# Patient Record
Sex: Female | Born: 1935 | Race: Black or African American | Hispanic: No | Smoking: Never smoker
Health system: Southern US, Community
[De-identification: ages and names within clinical notes are randomized; demographics above are authoritative.]

## PROBLEM LIST (undated history)

## (undated) DIAGNOSIS — S72002K Fracture of unspecified part of neck of left femur, subsequent encounter for closed fracture with nonunion: Secondary | ICD-10-CM

## (undated) DIAGNOSIS — I82409 Acute embolism and thrombosis of unspecified deep veins of unspecified lower extremity: Secondary | ICD-10-CM

## (undated) DIAGNOSIS — R011 Cardiac murmur, unspecified: Secondary | ICD-10-CM

## (undated) DIAGNOSIS — D62 Acute posthemorrhagic anemia: Secondary | ICD-10-CM

## (undated) DIAGNOSIS — R05 Cough: Secondary | ICD-10-CM

## (undated) DIAGNOSIS — I10 Essential (primary) hypertension: Secondary | ICD-10-CM

## (undated) DIAGNOSIS — R059 Cough, unspecified: Secondary | ICD-10-CM

## (undated) DIAGNOSIS — D72829 Elevated white blood cell count, unspecified: Secondary | ICD-10-CM

## (undated) DIAGNOSIS — T8484XA Pain due to internal orthopedic prosthetic devices, implants and grafts, initial encounter: Secondary | ICD-10-CM

## (undated) DIAGNOSIS — L8990 Pressure ulcer of unspecified site, unspecified stage: Secondary | ICD-10-CM

## (undated) HISTORY — DX: Fracture of unspecified part of neck of left femur, subsequent encounter for closed fracture with nonunion: S72.002K

## (undated) HISTORY — DX: Cough, unspecified: R05.9

## (undated) HISTORY — DX: Acute posthemorrhagic anemia: D62

## (undated) HISTORY — DX: Pain due to internal orthopedic prosthetic devices, implants and grafts, initial encounter: T84.84XA

## (undated) HISTORY — DX: Essential (primary) hypertension: I10

## (undated) HISTORY — DX: Cough: R05

## (undated) HISTORY — DX: Pressure ulcer of unspecified site, unspecified stage: L89.90

## (undated) HISTORY — PX: HIP SURGERY: SHX245

## (undated) HISTORY — DX: Acute embolism and thrombosis of unspecified deep veins of unspecified lower extremity: I82.409

## (undated) HISTORY — DX: Elevated white blood cell count, unspecified: D72.829

## (undated) HISTORY — DX: Cardiac murmur, unspecified: R01.1

## (undated) SURGERY — HEMIARTHROPLASTY, HIP, DIRECT ANTERIOR APPROACH, FOR FRACTURE
Anesthesia: Choice | Laterality: Left

---

## 2010-08-27 ENCOUNTER — Emergency Department (HOSPITAL_COMMUNITY)
Admission: EM | Admit: 2010-08-27 | Discharge: 2010-08-27 | Disposition: A | Discharge status: Home / Self Care | Payer: Medicare Other | Attending: Emergency Medicine | Admitting: Emergency Medicine

## 2010-08-27 DIAGNOSIS — R1013 Epigastric pain: Secondary | ICD-10-CM | POA: Insufficient documentation

## 2010-08-27 DIAGNOSIS — G8929 Other chronic pain: Secondary | ICD-10-CM | POA: Insufficient documentation

## 2010-08-27 DIAGNOSIS — M79609 Pain in unspecified limb: Secondary | ICD-10-CM | POA: Insufficient documentation

## 2010-08-27 DIAGNOSIS — N949 Unspecified condition associated with female genital organs and menstrual cycle: Secondary | ICD-10-CM | POA: Insufficient documentation

## 2010-08-27 DIAGNOSIS — K3189 Other diseases of stomach and duodenum: Secondary | ICD-10-CM | POA: Insufficient documentation

## 2010-08-27 DIAGNOSIS — M2550 Pain in unspecified joint: Secondary | ICD-10-CM | POA: Insufficient documentation

## 2010-08-27 DIAGNOSIS — K219 Gastro-esophageal reflux disease without esophagitis: Secondary | ICD-10-CM | POA: Insufficient documentation

## 2010-08-27 DIAGNOSIS — M129 Arthropathy, unspecified: Secondary | ICD-10-CM | POA: Insufficient documentation

## 2010-08-27 DIAGNOSIS — K2970 Gastritis, unspecified, without bleeding: Secondary | ICD-10-CM | POA: Insufficient documentation

## 2010-08-27 DIAGNOSIS — K2990 Gastroduodenitis, unspecified, without bleeding: Secondary | ICD-10-CM | POA: Insufficient documentation

## 2010-08-27 LAB — CBC
MCH: 31.3 pg (ref 26.0–34.0)
MCHC: 35.3 g/dL (ref 30.0–36.0)
MCV: 88.7 fL (ref 78.0–100.0)
Platelets: 409 10*3/uL — ABNORMAL HIGH (ref 150–400)

## 2010-08-27 LAB — BASIC METABOLIC PANEL
Calcium: 9.3 mg/dL (ref 8.4–10.5)
Creatinine, Ser: 1.02 mg/dL (ref 0.4–1.2)
GFR calc non Af Amer: 53 mL/min — ABNORMAL LOW (ref >60)
Glucose, Bld: 112 mg/dL — ABNORMAL HIGH (ref 70–99)
Sodium: 139 mEq/L (ref 135–145)

## 2010-11-25 ENCOUNTER — Inpatient Hospital Stay (INDEPENDENT_AMBULATORY_CARE_PROVIDER_SITE_OTHER)
Admission: RE | Admit: 2010-11-25 | Discharge: 2010-11-25 | Disposition: A | Discharge status: Home / Self Care | Payer: Medicare Other | Source: Ambulatory Visit | Attending: Emergency Medicine | Admitting: Emergency Medicine

## 2010-11-25 DIAGNOSIS — K219 Gastro-esophageal reflux disease without esophagitis: Secondary | ICD-10-CM

## 2010-11-25 LAB — POCT URINALYSIS DIP (DEVICE)
Glucose, UA: NEGATIVE mg/dL
Nitrite: NEGATIVE
Protein, ur: NEGATIVE mg/dL
Specific Gravity, Urine: 1.02 (ref 1.005–1.030)
Urobilinogen, UA: 0.2 mg/dL (ref 0.0–1.0)

## 2011-01-05 ENCOUNTER — Emergency Department (HOSPITAL_COMMUNITY): Payer: Medicare Other

## 2011-01-05 ENCOUNTER — Emergency Department (HOSPITAL_COMMUNITY)
Admission: EM | Admit: 2011-01-05 | Discharge: 2011-01-05 | Disposition: A | Discharge status: Home / Self Care | Payer: Medicare Other | Attending: Emergency Medicine | Admitting: Emergency Medicine

## 2011-01-05 DIAGNOSIS — G8929 Other chronic pain: Secondary | ICD-10-CM | POA: Insufficient documentation

## 2011-01-05 DIAGNOSIS — R109 Unspecified abdominal pain: Secondary | ICD-10-CM | POA: Insufficient documentation

## 2011-01-05 DIAGNOSIS — M129 Arthropathy, unspecified: Secondary | ICD-10-CM | POA: Insufficient documentation

## 2011-01-05 DIAGNOSIS — R4789 Other speech disturbances: Secondary | ICD-10-CM | POA: Insufficient documentation

## 2011-01-05 LAB — COMPREHENSIVE METABOLIC PANEL
AST: 21 U/L (ref 0–37)
Albumin: 3.3 g/dL — ABNORMAL LOW (ref 3.5–5.2)
BUN: 17 mg/dL (ref 6–23)
Calcium: 9.5 mg/dL (ref 8.4–10.5)
Chloride: 103 mEq/L (ref 96–112)
Creatinine, Ser: 0.79 mg/dL (ref 0.50–1.10)
Total Bilirubin: 0.2 mg/dL — ABNORMAL LOW (ref 0.3–1.2)
Total Protein: 8 g/dL (ref 6.0–8.3)

## 2011-01-05 LAB — POCT I-STAT TROPONIN I

## 2011-01-05 LAB — DIFFERENTIAL
Eosinophils Absolute: 0.5 10*3/uL (ref 0.0–0.7)
Eosinophils Relative: 4 % (ref 0–5)
Lymphocytes Relative: 32 % (ref 12–46)
Lymphs Abs: 3.6 10*3/uL (ref 0.7–4.0)
Monocytes Absolute: 0.8 10*3/uL (ref 0.1–1.0)
Monocytes Relative: 7 % (ref 3–12)

## 2011-01-05 LAB — CBC
HCT: 39.9 % (ref 36.0–46.0)
MCH: 31.4 pg (ref 26.0–34.0)
MCHC: 34.8 g/dL (ref 30.0–36.0)
MCV: 90.1 fL (ref 78.0–100.0)
Platelets: 398 10*3/uL (ref 150–400)
RDW: 14 % (ref 11.5–15.5)
WBC: 11.4 10*3/uL — ABNORMAL HIGH (ref 4.0–10.5)

## 2011-01-05 LAB — LIPASE, BLOOD: Lipase: 24 U/L (ref 11–59)

## 2014-03-04 ENCOUNTER — Emergency Department (HOSPITAL_COMMUNITY)
Admission: EM | Admit: 2014-03-04 | Discharge: 2014-03-04 | Disposition: A | Discharge status: Home / Self Care | Payer: Medicare Other | Attending: Emergency Medicine | Admitting: Emergency Medicine

## 2014-03-04 ENCOUNTER — Encounter (HOSPITAL_COMMUNITY): Payer: Self-pay | Admitting: Emergency Medicine

## 2014-03-04 DIAGNOSIS — K219 Gastro-esophageal reflux disease without esophagitis: Secondary | ICD-10-CM | POA: Diagnosis not present

## 2014-03-04 DIAGNOSIS — H6121 Impacted cerumen, right ear: Secondary | ICD-10-CM | POA: Diagnosis not present

## 2014-03-04 DIAGNOSIS — Z79899 Other long term (current) drug therapy: Secondary | ICD-10-CM | POA: Insufficient documentation

## 2014-03-04 DIAGNOSIS — K2100 Gastro-esophageal reflux disease with esophagitis, without bleeding: Secondary | ICD-10-CM

## 2014-03-04 DIAGNOSIS — R109 Unspecified abdominal pain: Secondary | ICD-10-CM | POA: Diagnosis present

## 2014-03-04 LAB — CBC WITH DIFFERENTIAL/PLATELET
BASOS PCT: 0 % (ref 0–1)
Basophils Absolute: 0 10*3/uL (ref 0.0–0.1)
EOS ABS: 0.5 10*3/uL (ref 0.0–0.7)
Eosinophils Relative: 5 % (ref 0–5)
HCT: 35.9 % — ABNORMAL LOW (ref 36.0–46.0)
HEMOGLOBIN: 12.4 g/dL (ref 12.0–15.0)
Lymphocytes Relative: 36 % (ref 12–46)
Lymphs Abs: 3.8 10*3/uL (ref 0.7–4.0)
MCH: 30.9 pg (ref 26.0–34.0)
MCHC: 34.5 g/dL (ref 30.0–36.0)
MCV: 89.5 fL (ref 78.0–100.0)
MONOS PCT: 7 % (ref 3–12)
Monocytes Absolute: 0.7 10*3/uL (ref 0.1–1.0)
NEUTROS PCT: 52 % (ref 43–77)
Neutro Abs: 5.5 10*3/uL (ref 1.7–7.7)
PLATELETS: 332 10*3/uL (ref 150–400)
RBC: 4.01 MIL/uL (ref 3.87–5.11)
RDW: 14.4 % (ref 11.5–15.5)
WBC: 10.5 10*3/uL (ref 4.0–10.5)

## 2014-03-04 LAB — COMPREHENSIVE METABOLIC PANEL
ALBUMIN: 3.5 g/dL (ref 3.5–5.2)
ALK PHOS: 84 U/L (ref 39–117)
ALT: 11 U/L (ref 0–35)
ANION GAP: 13 (ref 5–15)
AST: 19 U/L (ref 0–37)
BUN: 17 mg/dL (ref 6–23)
CALCIUM: 9.4 mg/dL (ref 8.4–10.5)
CO2: 24 mEq/L (ref 19–32)
CREATININE: 0.67 mg/dL (ref 0.50–1.10)
Chloride: 102 mEq/L (ref 96–112)
GFR calc Af Amer: >90 mL/min (ref >90)
GFR calc non Af Amer: 83 mL/min — ABNORMAL LOW (ref >90)
Glucose, Bld: 88 mg/dL (ref 70–99)
POTASSIUM: 3.7 meq/L (ref 3.7–5.3)
Sodium: 139 mEq/L (ref 137–147)
TOTAL PROTEIN: 8.2 g/dL (ref 6.0–8.3)
Total Bilirubin: 0.3 mg/dL (ref 0.3–1.2)

## 2014-03-04 LAB — LIPASE, BLOOD: LIPASE: 18 U/L (ref 11–59)

## 2014-03-04 MED ORDER — PANTOPRAZOLE SODIUM 20 MG PO TBEC: 20.0000 mg | DELAYED_RELEASE_TABLET | Freq: Every day | ORAL | Status: DC

## 2014-03-04 NOTE — Discharge Instructions (Signed)
Cerumen Impaction A cerumen impaction is when the wax in your ear forms a plug. This plug usually causes reduced hearing. Sometimes it also causes an earache or dizziness. Removing a cerumen impaction can be difficult and painful. The wax sticks to the ear canal. The canal is sensitive and bleeds easily. If you try to remove a heavy wax buildup with a cotton tipped swab, you may push it in further. Irrigation with water, suction, and small ear curettes may be used to clear out the wax. If the impaction is fixed to the skin in the ear canal, ear drops may be needed for a few days to loosen the wax. People who build up a lot of wax frequently can use ear wax removal products available in your local drugstore. SEEK MEDICAL CARE IF:  You develop an earache, increased hearing loss, or marked dizziness. Document Released: 04/10/2004 Document Revised: 05/26/2011 Document Reviewed: 05/31/2009 [facility] Patient Information 2015 [place], [place]. This information is not intended to replace advice given to you by your health care provider. Make sure you discuss any questions you have with your health care provider. Esophagitis Esophagitis is inflammation of the esophagus. It can involve swelling, soreness, and pain in the esophagus. This condition can make it difficult and painful to swallow. CAUSES  Most causes of esophagitis are not serious. Many different factors can cause esophagitis, including:  Gastroesophageal reflux disease (GERD). This is when acid from your stomach flows up into the esophagus.  Recurrent vomiting.  An allergic-type reaction.  Certain medicines, especially those that come in large pills.  Ingestion of harmful chemicals, such as household cleaning products.  Heavy alcohol use.  An infection of the esophagus.  Radiation treatment for cancer.  Certain diseases such as sarcoidosis, Crohn's disease, and scleroderma. These diseases may cause recurrent esophagitis. SYMPTOMS    Trouble swallowing.  Painful swallowing.  Chest pain.  Difficulty breathing.  Nausea.  Vomiting.  Abdominal pain. DIAGNOSIS  Your caregiver will take your history and do a physical exam. Depending upon what your caregiver finds, certain tests may also be done, including:  Barium X-ray. You will drink a solution that coats the esophagus, and X-rays will be taken.  Endoscopy. A lighted tube is put down the esophagus so your caregiver can examine the area.  Allergy tests. These can sometimes be arranged through follow-up visits. TREATMENT  Treatment will depend on the cause of your esophagitis. In some cases, steroids or other medicines may be given to help relieve your symptoms or to treat the underlying cause of your condition. Medicines that may be recommended include:  Viscous lidocaine, to soothe the esophagus.  Antacids.  Acid reducers.  Proton pump inhibitors.  Antiviral medicines for certain viral infections of the esophagus.  Antifungal medicines for certain fungal infections of the esophagus.  Antibiotic medicines, depending on the cause of the esophagitis. HOME CARE INSTRUCTIONS   Avoid foods and drinks that seem to make your symptoms worse.  Eat small, frequent meals instead of large meals.  Avoid eating for the 3 hours prior to your bedtime.  If you have trouble taking pills, use a pill splitter to decrease the size and likelihood of the pill getting stuck or injuring the esophagus on the way down. Drinking water after taking a pill also helps.  Stop smoking if you smoke.  Maintain a healthy weight.  Wear loose-fitting clothing. Do not wear anything tight around your waist that causes pressure on your stomach.  Raise the head of your bed  6 to 8 inches with wood blocks to help you sleep. Extra pillows will not help.  Only take over-the-counter or prescription medicines as directed by your caregiver. SEEK IMMEDIATE MEDICAL CARE IF:  You have  severe chest pain that radiates into your arm, neck, or jaw.  You feel sweaty, dizzy, or lightheaded.  You have shortness of breath.  You vomit blood.  You have difficulty or pain with swallowing.  You have bloody or black, tarry stools.  You have a fever.  You have a burning sensation in the chest more than 3 times a week for more than 2 weeks.  You cannot swallow, drink, or eat.  You drool because you cannot swallow your saliva. MAKE SURE YOU:  Understand these instructions.  Will watch your condition.  Will get help right away if you are not doing well or get worse. Document Released: 04/10/2004 Document Revised: 05/26/2011 Document Reviewed: 11/01/2010 [facility] Patient Information 2015 [place], [place]. This information is not intended to replace advice given to you by your health care provider. Make sure you discuss any questions you have with your health care provider.

## 2014-03-04 NOTE — ED Notes (Signed)
Pt here with family c/o abd pain after eating every day for 30 years; pt sts some pain in ears

## 2014-03-04 NOTE — ED Provider Notes (Signed)
CSN: [id number]     Arrival date & time 03/04/14  1400 History   First MD Initiated Contact with Patient 03/04/14 1530     Chief Complaint  Patient presents with  . Abdominal Pain  . Otalgia     (Consider location/radiation/quality/duration/timing/severity/associated sxs/prior Treatment) Patient is a 80 y.o. female presenting with ear pain. The history is provided by a relative.  Otalgia Location:  Right Quality:  Aching and sore Severity:  Moderate Onset quality:  Gradual Timing:  Constant Progression:  Worsening Chronicity:  New Relieved by:  Nothing Worsened by:  Nothing tried Associated symptoms: abdominal pain   Pt has had reflux and abdominal pain for years  30 plus per son.  No relief with otc medications.   He would like pt to be on a stronger medication and request name of gi doctor  History reviewed. No pertinent past medical history. History reviewed. No pertinent past surgical history. History reviewed. No pertinent family history. History  Substance Use Topics  . Smoking status: Never Smoker   . Smokeless tobacco: Not on file  . Alcohol Use: No   OB History    No data available     Review of Systems  HENT: Positive for ear pain.   Gastrointestinal: Positive for abdominal pain.  All other systems reviewed and are negative.     Allergies  Review of patient's allergies indicates no known allergies.  Home Medications   Prior to Admission medications   Medication Sig Start Date End Date Taking? Authorizing Provider  acetaminophen (TYLENOL) 500 MG tablet Take 500 mg by mouth every 6 (six) hours as needed for mild pain.   Yes Historical Provider, MD  omeprazole (PRILOSEC) 20 MG capsule Take 20 mg by mouth daily.   Yes Historical Provider, MD   BP 163/59 mmHg  Pulse 76  Temp(Src) 98.1 F (36.7 C) (Oral)  Resp 16  Wt 134 lb 2 oz (60.839 kg)  SpO2 98% Physical Exam  Constitutional: She appears well-developed and well-nourished.  HENT:  Head:  Normocephalic.  Cerumen impaction right ear  Eyes: Conjunctivae are normal. Pupils are equal, round, and reactive to light.  Neck: Normal range of motion.  Cardiovascular: Normal rate.   Pulmonary/Chest: Effort normal.  Abdominal: Soft. She exhibits no distension. There is no tenderness. There is no rebound.  Neurological: She is alert.  Skin: Skin is warm.  Psychiatric: She has a normal mood and affect.    ED Course  Procedures (including critical care time) Labs Review Labs Reviewed  CBC WITH DIFFERENTIAL - Abnormal; Notable for the following:    HCT 35.9 (*)    All other components within normal limits  COMPREHENSIVE METABOLIC PANEL - Abnormal; Notable for the following:    GFR calc non Af Amer 83 (*)    All other components within normal limits  LIPASE, BLOOD    Imaging Review No results found.   EKG Interpretation None      MDM   Final diagnoses:  Reflux esophagitis  Cerumen impaction, right    Irrigate ear Schedule to see Gi doctor for evaluation protonix    Elson AreasLeslie K Sofia, PA-C 03/04/14 [id number]1645  Leslie K WolfordSofia, PA-C 03/04/14 1707  Rolland PorterMark James, MD 03/06/14 2206

## 2014-05-11 ENCOUNTER — Emergency Department (HOSPITAL_COMMUNITY)
Admission: EM | Admit: 2014-05-11 | Discharge: 2014-05-11 | Disposition: A | Discharge status: Home / Self Care | Payer: Medicare Other | Attending: Emergency Medicine | Admitting: Emergency Medicine

## 2014-05-11 ENCOUNTER — Encounter (HOSPITAL_COMMUNITY): Payer: Self-pay | Admitting: Emergency Medicine

## 2014-05-11 DIAGNOSIS — Z79899 Other long term (current) drug therapy: Secondary | ICD-10-CM | POA: Insufficient documentation

## 2014-05-11 DIAGNOSIS — R197 Diarrhea, unspecified: Secondary | ICD-10-CM | POA: Diagnosis not present

## 2014-05-11 DIAGNOSIS — R112 Nausea with vomiting, unspecified: Secondary | ICD-10-CM | POA: Diagnosis not present

## 2014-05-11 DIAGNOSIS — R1010 Upper abdominal pain, unspecified: Secondary | ICD-10-CM | POA: Insufficient documentation

## 2014-05-11 LAB — CBC WITH DIFFERENTIAL/PLATELET
BASOS PCT: 0 % (ref 0–1)
Basophils Absolute: 0 10*3/uL (ref 0.0–0.1)
Eosinophils Absolute: 0.4 10*3/uL (ref 0.0–0.7)
Eosinophils Relative: 3 % (ref 0–5)
HCT: 35.9 % — ABNORMAL LOW (ref 36.0–46.0)
Hemoglobin: 12.4 g/dL (ref 12.0–15.0)
LYMPHS PCT: 17 % (ref 12–46)
Lymphs Abs: 2.1 10*3/uL (ref 0.7–4.0)
MCH: 31.8 pg (ref 26.0–34.0)
MCHC: 34.5 g/dL (ref 30.0–36.0)
MCV: 92.1 fL (ref 78.0–100.0)
Monocytes Absolute: 0.9 10*3/uL (ref 0.1–1.0)
Monocytes Relative: 8 % (ref 3–12)
Neutro Abs: 8.6 10*3/uL — ABNORMAL HIGH (ref 1.7–7.7)
Neutrophils Relative %: 72 % (ref 43–77)
PLATELETS: 435 10*3/uL — AB (ref 150–400)
RBC: 3.9 MIL/uL (ref 3.87–5.11)
RDW: 13.9 % (ref 11.5–15.5)
WBC: 12.1 10*3/uL — AB (ref 4.0–10.5)

## 2014-05-11 LAB — BASIC METABOLIC PANEL
Anion gap: 9 (ref 5–15)
BUN: 11 mg/dL (ref 6–23)
CO2: 28 mmol/L (ref 19–32)
Calcium: 9 mg/dL (ref 8.4–10.5)
Chloride: 100 mmol/L (ref 96–112)
Creatinine, Ser: 0.82 mg/dL (ref 0.50–1.10)
GFR calc Af Amer: 77 mL/min — ABNORMAL LOW (ref >90)
GFR calc non Af Amer: 67 mL/min — ABNORMAL LOW (ref >90)
GLUCOSE: 107 mg/dL — AB (ref 70–99)
POTASSIUM: 3.6 mmol/L (ref 3.5–5.1)
SODIUM: 137 mmol/L (ref 135–145)

## 2014-05-11 MED ORDER — MORPHINE SULFATE 4 MG/ML IJ SOLN
4.0000 mg | Freq: Once | INTRAMUSCULAR | Status: AC
  Administered 2014-05-11: 4 mg via INTRAVENOUS
  Filled 2014-05-11: qty 1

## 2014-05-11 MED ORDER — SODIUM CHLORIDE 0.9 % IV BOLUS (SEPSIS)
1000.0000 mL | Freq: Once | INTRAVENOUS | Status: AC
  Administered 2014-05-11: 1000 mL via INTRAVENOUS

## 2014-05-11 MED ORDER — ONDANSETRON HCL 4 MG/2ML IJ SOLN
4.0000 mg | Freq: Once | INTRAMUSCULAR | Status: AC
  Administered 2014-05-11: 4 mg via INTRAVENOUS
  Filled 2014-05-11: qty 2

## 2014-05-11 MED ORDER — ONDANSETRON 8 MG PO TBDP: 8.0000 mg | ORAL_TABLET | Freq: Three times a day (TID) | ORAL | Status: DC | PRN

## 2014-05-11 NOTE — Discharge Instructions (Signed)

## 2014-05-11 NOTE — ED Notes (Signed)
Dr. Campos at bedside   

## 2014-05-11 NOTE — ED Provider Notes (Signed)
CSN: [id number]     Arrival date & time 05/11/14  0411 History   First MD Initiated Contact with Patient 05/11/14 [id number]     Chief Complaint  Patient presents with  . Emesis  . Diarrhea      The history is provided by the patient.   patient presents emergency department complaining of 24 hours of nausea vomiting and diarrhea.  She reports some mild upper abdominal cramping.  Multiple sick contacts of benefit in the household.  No reports of fevers or chills.  No urinary complaints except for some occasional urge incontinence.  No rash.  No back pain or flank pain.  No chest pain shortness of breath.  Denies productive cough.    No past medical history on file. History reviewed. No pertinent past surgical history. No family history on file. History  Substance Use Topics  . Smoking status: Never Smoker   . Smokeless tobacco: Not on file  . Alcohol Use: No   OB History    No data available     Review of Systems  All other systems reviewed and are negative.     Allergies  Review of patient's allergies indicates no known allergies.  Home Medications   Prior to Admission medications   Medication Sig Start Date End Date Taking? Authorizing Provider  acetaminophen (TYLENOL) 500 MG tablet Take 500 mg by mouth every 6 (six) hours as needed for mild pain.   Yes Historical Provider, MD  omeprazole (PRILOSEC) 20 MG capsule Take 20 mg by mouth daily as needed (acid reflux).    Yes Historical Provider, MD  pantoprazole (PROTONIX) 20 MG tablet Take 1 tablet (20 mg total) by mouth daily. Patient not taking: Reported on 05/11/2014 03/04/14   Elson AreasLeslie K Sofia, PA-C   BP 176/68 mmHg  Pulse 78  Temp(Src) 98.8 F (37.1 C) (Oral)  Resp 20  SpO2 97% Physical Exam  Constitutional: She is oriented to person, place, and time. She appears well-developed and well-nourished. No distress.  HENT:  Head: Normocephalic and atraumatic.  Eyes: EOM are normal.  Neck: Normal range of motion.   Cardiovascular: Normal rate, regular rhythm and normal heart sounds.   Pulmonary/Chest: Effort normal and breath sounds normal.  Abdominal: Soft. She exhibits no distension. There is no tenderness.  Musculoskeletal: Normal range of motion.  Neurological: She is alert and oriented to person, place, and time.  Skin: Skin is warm and dry.  Psychiatric: She has a normal mood and affect. Judgment normal.  Nursing note and vitals reviewed.   ED Course  Procedures (including critical care time) Labs Review Labs Reviewed  CBC WITH DIFFERENTIAL/PLATELET - Abnormal; Notable for the following:    WBC 12.1 (*)    HCT 35.9 (*)    Platelets 435 (*)    Neutro Abs 8.6 (*)    All other components within normal limits  BASIC METABOLIC PANEL - Abnormal; Notable for the following:    Glucose, Bld 107 (*)    GFR calc non Af Amer 67 (*)    GFR calc Af Amer 77 (*)    All other components within normal limits    Imaging Review No results found.   EKG Interpretation   Date/Time:  Thursday May 11 2014 04:24:41 EST Ventricular Rate:  92 PR Interval:  140 QRS Duration: 97 QT Interval:  366 QTC Calculation: 453 R Axis:   54 Text Interpretation:  Sinus rhythm Abnormal R-wave progression, early  transition Minimal ST elevation, anterior leads Baseline wander  in lead(s)  III aVF V6 No significant change was found Confirmed by Casanova Schurman  MD, Celine Dishman  ([id number]) on 05/11/2014 4:30:37 AM      MDM   Final diagnoses:  Nausea vomiting and diarrhea   7:34 AM Patient feels much better at this time.  Discharge home in good condition.  Home with antinausea medicine.  Suspect viral process.  Patient with multiple sick contacts.  She understands to return to the ER for new or worsening symptoms    Lyanne Co, MD 05/11/14 [phone]

## 2014-05-11 NOTE — ED Notes (Signed)
Patient does not speak english, son at bedside. Language, somali. States she has upper abdominal pain and is relieved with vomiting but states she has only vomited once. Diarrhea X2. All other family has also been sick and previously visited the ED for illness. Son states she does not like to complain and will not tell the extent of her pain or illness.

## 2015-04-27 ENCOUNTER — Encounter (HOSPITAL_COMMUNITY): Payer: Self-pay | Admitting: Emergency Medicine

## 2015-04-27 ENCOUNTER — Emergency Department (HOSPITAL_COMMUNITY): Payer: Medicare Other

## 2015-04-27 ENCOUNTER — Inpatient Hospital Stay (HOSPITAL_COMMUNITY)
Admission: EM | Admit: 2015-04-27 | Discharge: 2015-05-01 | DRG: 469 | Disposition: A | Discharge status: Skilled Nursing Facility | Payer: Medicare Other | Attending: Internal Medicine | Admitting: Internal Medicine

## 2015-04-27 DIAGNOSIS — T84091A Other mechanical complication of internal left hip prosthesis, initial encounter: Secondary | ICD-10-CM | POA: Diagnosis not present

## 2015-04-27 DIAGNOSIS — L8990 Pressure ulcer of unspecified site, unspecified stage: Secondary | ICD-10-CM | POA: Insufficient documentation

## 2015-04-27 DIAGNOSIS — D62 Acute posthemorrhagic anemia: Secondary | ICD-10-CM | POA: Diagnosis not present

## 2015-04-27 DIAGNOSIS — R059 Cough, unspecified: Secondary | ICD-10-CM

## 2015-04-27 DIAGNOSIS — I82409 Acute embolism and thrombosis of unspecified deep veins of unspecified lower extremity: Secondary | ICD-10-CM

## 2015-04-27 DIAGNOSIS — S72002K Fracture of unspecified part of neck of left femur, subsequent encounter for closed fracture with nonunion: Secondary | ICD-10-CM | POA: Diagnosis present

## 2015-04-27 DIAGNOSIS — S72002A Fracture of unspecified part of neck of left femur, initial encounter for closed fracture: Secondary | ICD-10-CM

## 2015-04-27 DIAGNOSIS — I82492 Acute embolism and thrombosis of other specified deep vein of left lower extremity: Secondary | ICD-10-CM | POA: Diagnosis present

## 2015-04-27 DIAGNOSIS — W19XXXA Unspecified fall, initial encounter: Secondary | ICD-10-CM | POA: Diagnosis present

## 2015-04-27 DIAGNOSIS — R05 Cough: Secondary | ICD-10-CM | POA: Diagnosis present

## 2015-04-27 DIAGNOSIS — Z79899 Other long term (current) drug therapy: Secondary | ICD-10-CM

## 2015-04-27 DIAGNOSIS — R011 Cardiac murmur, unspecified: Secondary | ICD-10-CM | POA: Diagnosis present

## 2015-04-27 DIAGNOSIS — Z96649 Presence of unspecified artificial hip joint: Secondary | ICD-10-CM

## 2015-04-27 DIAGNOSIS — I82442 Acute embolism and thrombosis of left tibial vein: Secondary | ICD-10-CM | POA: Diagnosis present

## 2015-04-27 DIAGNOSIS — T8484XA Pain due to internal orthopedic prosthetic devices, implants and grafts, initial encounter: Secondary | ICD-10-CM | POA: Diagnosis present

## 2015-04-27 DIAGNOSIS — D72829 Elevated white blood cell count, unspecified: Secondary | ICD-10-CM | POA: Diagnosis present

## 2015-04-27 DIAGNOSIS — M25552 Pain in left hip: Secondary | ICD-10-CM | POA: Diagnosis not present

## 2015-04-27 DIAGNOSIS — S72012A Unspecified intracapsular fracture of left femur, initial encounter for closed fracture: Secondary | ICD-10-CM | POA: Diagnosis present

## 2015-04-27 LAB — CBC WITH DIFFERENTIAL/PLATELET
BASOS ABS: 0 10*3/uL (ref 0.0–0.1)
BASOS PCT: 0 %
EOS ABS: 0.6 10*3/uL (ref 0.0–0.7)
Eosinophils Relative: 5 %
HCT: 33.4 % — ABNORMAL LOW (ref 36.0–46.0)
Hemoglobin: 11.3 g/dL — ABNORMAL LOW (ref 12.0–15.0)
Lymphocytes Relative: 39 %
Lymphs Abs: 4.4 10*3/uL — ABNORMAL HIGH (ref 0.7–4.0)
MCH: 30.3 pg (ref 26.0–34.0)
MCHC: 33.8 g/dL (ref 30.0–36.0)
MCV: 89.5 fL (ref 78.0–100.0)
MONO ABS: 1 10*3/uL (ref 0.1–1.0)
Monocytes Relative: 9 %
NEUTROS ABS: 5.4 10*3/uL (ref 1.7–7.7)
NEUTROS PCT: 47 %
PLATELETS: 332 10*3/uL (ref 150–400)
RBC: 3.73 MIL/uL — ABNORMAL LOW (ref 3.87–5.11)
RDW: 17.7 % — ABNORMAL HIGH (ref 11.5–15.5)
SMEAR REVIEW: ADEQUATE
WBC: 11.4 10*3/uL — ABNORMAL HIGH (ref 4.0–10.5)

## 2015-04-27 LAB — COMPREHENSIVE METABOLIC PANEL
ALBUMIN: 3 g/dL — AB (ref 3.5–5.0)
ALT: 15 U/L (ref 14–54)
ANION GAP: 15 (ref 5–15)
AST: 23 U/L (ref 15–41)
Alkaline Phosphatase: 83 U/L (ref 38–126)
BUN: 14 mg/dL (ref 6–20)
CHLORIDE: 103 mmol/L (ref 101–111)
CO2: 21 mmol/L — AB (ref 22–32)
Calcium: 9.4 mg/dL (ref 8.9–10.3)
Creatinine, Ser: 0.68 mg/dL (ref 0.44–1.00)
GFR calc non Af Amer: >60 mL/min (ref >60)
Glucose, Bld: 85 mg/dL (ref 65–99)
Potassium: 3.8 mmol/L (ref 3.5–5.1)
SODIUM: 139 mmol/L (ref 135–145)
Total Bilirubin: 0.5 mg/dL (ref 0.3–1.2)
Total Protein: 7 g/dL (ref 6.5–8.1)

## 2015-04-27 LAB — URINALYSIS, ROUTINE W REFLEX MICROSCOPIC
Bilirubin Urine: NEGATIVE
Glucose, UA: NEGATIVE mg/dL
Hgb urine dipstick: NEGATIVE
KETONES UR: NEGATIVE mg/dL
NITRITE: NEGATIVE
PH: 5 (ref 5.0–8.0)
Protein, ur: NEGATIVE mg/dL
SPECIFIC GRAVITY, URINE: 1.02 (ref 1.005–1.030)

## 2015-04-27 LAB — URINE MICROSCOPIC-ADD ON

## 2015-04-27 LAB — D-DIMER, QUANTITATIVE (NOT AT ARMC): D DIMER QUANT: 3.07 ug{FEU}/mL — AB (ref 0.00–0.50)

## 2015-04-27 MED ORDER — OXYCODONE-ACETAMINOPHEN 5-325 MG PO TABS
1.0000 | ORAL_TABLET | Freq: Once | ORAL | Status: AC
  Administered 2015-04-27: 1 via ORAL

## 2015-04-27 MED ORDER — MORPHINE SULFATE (PF) 4 MG/ML IV SOLN
4.0000 mg | Freq: Once | INTRAVENOUS | Status: AC
  Administered 2015-04-27: 4 mg via INTRAVENOUS
  Filled 2015-04-27: qty 1

## 2015-04-27 MED ORDER — DIPHENHYDRAMINE HCL 25 MG PO CAPS
25.0000 mg | ORAL_CAPSULE | Freq: Once | ORAL | Status: AC
  Administered 2015-04-27: 25 mg via ORAL
  Filled 2015-04-27: qty 1

## 2015-04-27 MED ORDER — OXYCODONE-ACETAMINOPHEN 5-325 MG PO TABS
ORAL_TABLET | ORAL | Status: AC
  Filled 2015-04-27: qty 1

## 2015-04-27 NOTE — ED Provider Notes (Signed)
CSN: [id number]     Arrival date & time 04/27/15  1557 History   First MD Initiated Contact with Patient 04/27/15 1805     Chief Complaint  Patient presents with  . Hip Pain   Patient speaks  [place]. History is obtained using professional interpreter using [employer] line and also pain from granddaughter her company's her  (Consider location/radiation/quality/duration/timing/severity/associated sxs/prior Treatment) HPI Complains of left hip pain for possibly 4 months. Patient fell several months ago fracturing her left hip had ORIF of left hip performed in [place]. She arrives here directly from the airport after having spent 8 months in [place]. No other associated symptoms. No treatment prior to coming here. History reviewed. No pertinent past medical history. past medical history hypertension Past Surgical History  Procedure Laterality Date  . Hip surgery     No family history on file. Social History  Substance Use Topics  . Smoking status: Never Smoker   . Smokeless tobacco: None  . Alcohol Use: No   OB History    No data available     Review of Systems  Musculoskeletal: Positive for arthralgias and gait problem.       Left hip pain Walks with walker  All other systems reviewed and are negative.     Allergies  Review of patient's allergies indicates no known allergies.  Home Medications   Prior to Admission medications   Medication Sig Start Date End Date Taking? Authorizing Provider  acetaminophen (TYLENOL) 500 MG tablet Take 500 mg by mouth every 6 (six) hours as needed for mild pain.    Historical Provider, MD  omeprazole (PRILOSEC) 20 MG capsule Take 20 mg by mouth daily as needed (acid reflux).     Historical Provider, MD  ondansetron (ZOFRAN ODT) 8 MG disintegrating tablet Take 1 tablet (8 mg total) by mouth every 8 (eight) hours as needed for nausea or vomiting. 05/11/14   Azalia Bilis, MD  pantoprazole (PROTONIX) 20 MG tablet Take 1 tablet (20 mg  total) by mouth daily. Patient not taking: Reported on 05/11/2014 03/04/14   Lonia Skinner Sofia, PA-C   BP 226/90 mmHg  Pulse 98  Temp(Src) 98.1 F (36.7 C) (Oral)  Resp 18  SpO2 97% Physical Exam  Constitutional: She appears well-developed and well-nourished. She appears distressed.  Appears mildly uncomfortable  HENT:  Head: Normocephalic and atraumatic.  Eyes: Conjunctivae are normal. Pupils are equal, round, and reactive to light.  Neck: Neck supple. No tracheal deviation present. No thyromegaly present.  Cardiovascular: Normal rate and regular rhythm.   No murmur heard. Pulmonary/Chest: Effort normal and breath sounds normal.  Abdominal: Soft. Bowel sounds are normal. She exhibits no distension. There is no tenderness.  Musculoskeletal: Normal range of motion. She exhibits no edema or tenderness.  Left lower extremity without deformity redness or swelling. She has pain at hip on internal and external rotation of the thigh  Neurological: She is alert. Coordination normal.  Skin: Skin is warm and dry. No rash noted.  Psychiatric: She has a normal mood and affect.  Nursing note and vitals reviewed.   ED Course  Procedures (including critical care time) Labs Review Labs Reviewed - No data to display  Imaging Review Dg Hip Unilat With Pelvis 2-3 Views Left  04/27/2015  CLINICAL DATA:  Recent left hip surgery 5 months ago, pain ever since in left hip. Very limited range of motion. EXAM: DG HIP (WITH OR WITHOUT PELVIS) 2-3V LEFT COMPARISON:  None. FINDINGS: Three fixation screws  traverse a subcapital left femoral neck fracture site. There is a persistent diastasis at the fracture site which measures approximately 9 mm superiorly and perhaps up to 2 cm centrally. Associated angulation deformity at the fracture site. Femoral head remains grossly well positioned relative to the acetabulum. Osseous structures of the pelvis appear intact and well aligned throughout, although diffuse osteopenia  limits characterization of osseous detail. Mild degenerative change noted within the lower lumbar spine. Soft tissues about the pelvis and left hip are unremarkable. IMPRESSION: 1. Status post screw fixation of a subcapital left femoral neck fracture. Fairly marked diastasis persists at the fracture site, measuring 9 mm superiorly and up to approximately 2 cm centrally. Associated angulation deformity. Femoral head remains normally positioned relative to the acetabulum. Would consider surgical consult for possible revision. 2. No acute abnormality appreciated within the osseous pelvis. Electronically Signed   By: Bary Richard M.D.   On: 04/27/2015 17:02   I have personally reviewed and evaluated these images and lab results as part of my medical decision-making.   EKG Interpretation None     10:30 PM patient continues to complain of left hip pain. She also reports itching since earlier today when on airplane. Intravenous morphine, oral Benadryl ordered. X-rays reviewed by me Results for orders placed or performed during the hospital encounter of 04/27/15  D-dimer, quantitative (not at [facility])  Result Value Ref Range   D-Dimer, Quant 3.07 (H) 0.00 - 0.50 ug/mL-FEU  CBC with Differential/Platelet  Result Value Ref Range   WBC 11.4 (H) 4.0 - 10.5 K/uL   RBC 3.73 (L) 3.87 - 5.11 MIL/uL   Hemoglobin 11.3 (L) 12.0 - 15.0 g/dL   HCT 69.6 (L) 29.5 - 28.4 %   MCV 89.5 78.0 - 100.0 fL   MCH 30.3 26.0 - 34.0 pg   MCHC 33.8 30.0 - 36.0 g/dL   RDW 13.2 (H) 44.0 - 10.2 %   Platelets 332 150 - 400 K/uL   Neutrophils Relative % 47 %   Lymphocytes Relative 39 %   Monocytes Relative 9 %   Eosinophils Relative 5 %   Basophils Relative 0 %   Neutro Abs 5.4 1.7 - 7.7 K/uL   Lymphs Abs 4.4 (H) 0.7 - 4.0 K/uL   Monocytes Absolute 1.0 0.1 - 1.0 K/uL   Eosinophils Absolute 0.6 0.0 - 0.7 K/uL   Basophils Absolute 0.0 0.0 - 0.1 K/uL   Smear Review      PLATELET CLUMPS NOTED ON SMEAR, COUNT APPEARS ADEQUATE   Comprehensive metabolic panel  Result Value Ref Range   Sodium 139 135 - 145 mmol/L   Potassium 3.8 3.5 - 5.1 mmol/L   Chloride 103 101 - 111 mmol/L   CO2 21 (L) 22 - 32 mmol/L   Glucose, Bld 85 65 - 99 mg/dL   BUN 14 6 - 20 mg/dL   Creatinine, Ser 7.25 0.44 - 1.00 mg/dL   Calcium 9.4 8.9 - 36.6 mg/dL   Total Protein 7.0 6.5 - 8.1 g/dL   Albumin 3.0 (L) 3.5 - 5.0 g/dL   AST 23 15 - 41 U/L   ALT 15 14 - 54 U/L   Alkaline Phosphatase 83 38 - 126 U/L   Total Bilirubin 0.5 0.3 - 1.2 mg/dL   GFR calc non Af Amer >60 >60 mL/min   GFR calc Af Amer >60 >60 mL/min   Anion gap 15 5 - 15  Urinalysis, Routine w reflex microscopic (not at [facility])  Result Value Ref Range  Color, Urine YELLOW YELLOW   APPearance CLEAR CLEAR   Specific Gravity, Urine 1.020 1.005 - 1.030   pH 5.0 5.0 - 8.0   Glucose, UA NEGATIVE NEGATIVE mg/dL   Hgb urine dipstick NEGATIVE NEGATIVE   Bilirubin Urine NEGATIVE NEGATIVE   Ketones, ur NEGATIVE NEGATIVE mg/dL   Protein, ur NEGATIVE NEGATIVE mg/dL   Nitrite NEGATIVE NEGATIVE   Leukocytes, UA SMALL (A) NEGATIVE  Urine microscopic-add on  Result Value Ref Range   Squamous Epithelial / LPF 0-5 (A) NONE SEEN   WBC, UA 6-30 0 - 5 WBC/hpf   RBC / HPF 0-5 0 - 5 RBC/hpf   Bacteria, UA RARE (A) NONE SEEN   Urine-Other MUCOUS PRESENT    Dg Hip Unilat With Pelvis 2-3 Views Left  04/27/2015  CLINICAL DATA:  Recent left hip surgery 5 months ago, pain ever since in left hip. Very limited range of motion. EXAM: DG HIP (WITH OR WITHOUT PELVIS) 2-3V LEFT COMPARISON:  None. FINDINGS: Three fixation screws traverse a subcapital left femoral neck fracture site. There is a persistent diastasis at the fracture site which measures approximately 9 mm superiorly and perhaps up to 2 cm centrally. Associated angulation deformity at the fracture site. Femoral head remains grossly well positioned relative to the acetabulum. Osseous structures of the pelvis appear intact and well aligned  throughout, although diffuse osteopenia limits characterization of osseous detail. Mild degenerative change noted within the lower lumbar spine. Soft tissues about the pelvis and left hip are unremarkable. IMPRESSION: 1. Status post screw fixation of a subcapital left femoral neck fracture. Fairly marked diastasis persists at the fracture site, measuring 9 mm superiorly and up to approximately 2 cm centrally. Associated angulation deformity. Femoral head remains normally positioned relative to the acetabulum. Would consider surgical consult for possible revision. 2. No acute abnormality appreciated within the osseous pelvis. Electronically Signed   By: Bary Richard M.D.   On: 04/27/2015 17:02    MDM  Patient will spend the night in the ED as she has positive d-dimer and had recent trans oceanic flight.  Final diagnoses:  None    pretest clinical suspicion for DVT is low. Care management was consulted and evaluated patient and arranged home health including walker, PT and OT. She has outpatient follow-up arranged with Dr. Mikeal Hawthorne for primary care. Dr. Luiz Blare from orthopedic surgery was consulted and evaluated patient in the ED. He plans to perform ORIF of left hip tomorrow. He asked that she be admitted to medical service. I consulted Dr. Julian Reil who will arrange for admission. Diagnosis malunion of left hip fracture      Doug Sou, MD 04/28/15 1610

## 2015-04-27 NOTE — Care Management Note (Signed)
Case Management Note  Patient Details  Name: Amber Fisher MRN: [id number] Date of Birth: 08-Feb-1934  Subjective/Objective:      Patient presented to [facility] ED with hip pain. Patient just arrived from [place] after 8 months straight from airport to the ED.   Action/Plan: CM met with patient and son at bedside after receiving a  CM consult. [name] [phone] primay caregiver stated patient had hip surgery while in  [place] but did not receive PT post op. He states she is not able to walk without pain. Discussed recommendations for HHPT/OT services  patient and family agreeable to recommendations. Offered choice, [facility] selected. Patient does not have any equipment at home r/w and 3 N1 suggested family amendable. Explained it can be delivered to room prior to discharge home. Referral faxed to [facility] [phone] fax confirmation received. Discussed follow up with patient, family stated that the follow up will be with Dr. Jonelle Sidle. Updated Dr. Winfred Leeds, ED evaluation still pending. CM will follow up with patient on disposition plan.  Expected Discharge Date:       04/28/15   Expected Discharge Plan:  [place]  In-House Referral:     Discharge planning Services  CM Consult  Post Acute Care Choice:    Choice offered to:  Adult Children  DME Arranged:  3-N-1, Walker rolling DME Agency:  [place] Arranged:  PT, OT [place] Agency:  [place]  Status of Service:  Completed, signed off  Medicare Important Message Given:    Date Medicare IM Given:    Medicare IM give by:    Date Additional Medicare IM Given:    Additional Medicare Important Message give by:     If discussed at [place] of Stay Meetings, dates discussed:    Additional CommentsLaurena Slimmer, RN 04/27/2015, 10:46 PM

## 2015-04-27 NOTE — ED Notes (Signed)
Pts family reports that she broke her hip while in [place] and had surgery and just returned home today in excruciating pain. Pt alert x4.

## 2015-04-28 ENCOUNTER — Encounter (HOSPITAL_COMMUNITY): Payer: Medicare Other

## 2015-04-28 ENCOUNTER — Encounter (HOSPITAL_COMMUNITY)
Admission: EM | Disposition: A | Discharge status: Skilled Nursing Facility | Payer: Self-pay | Source: Home / Self Care | Attending: Internal Medicine

## 2015-04-28 ENCOUNTER — Inpatient Hospital Stay (HOSPITAL_COMMUNITY): Payer: Medicare Other

## 2015-04-28 ENCOUNTER — Inpatient Hospital Stay (HOSPITAL_COMMUNITY): Payer: Medicare Other | Admitting: Anesthesiology

## 2015-04-28 DIAGNOSIS — M79605 Pain in left leg: Secondary | ICD-10-CM | POA: Diagnosis not present

## 2015-04-28 DIAGNOSIS — W19XXXA Unspecified fall, initial encounter: Secondary | ICD-10-CM | POA: Diagnosis present

## 2015-04-28 DIAGNOSIS — S72012A Unspecified intracapsular fracture of left femur, initial encounter for closed fracture: Secondary | ICD-10-CM | POA: Diagnosis present

## 2015-04-28 DIAGNOSIS — S72002K Fracture of unspecified part of neck of left femur, subsequent encounter for closed fracture with nonunion: Secondary | ICD-10-CM | POA: Diagnosis not present

## 2015-04-28 DIAGNOSIS — I82442 Acute embolism and thrombosis of left tibial vein: Secondary | ICD-10-CM | POA: Diagnosis present

## 2015-04-28 DIAGNOSIS — T8484XA Pain due to internal orthopedic prosthetic devices, implants and grafts, initial encounter: Secondary | ICD-10-CM | POA: Diagnosis present

## 2015-04-28 DIAGNOSIS — R011 Cardiac murmur, unspecified: Secondary | ICD-10-CM | POA: Diagnosis present

## 2015-04-28 DIAGNOSIS — I824Z2 Acute embolism and thrombosis of unspecified deep veins of left distal lower extremity: Secondary | ICD-10-CM | POA: Diagnosis not present

## 2015-04-28 DIAGNOSIS — Z79899 Other long term (current) drug therapy: Secondary | ICD-10-CM | POA: Diagnosis not present

## 2015-04-28 DIAGNOSIS — R05 Cough: Secondary | ICD-10-CM | POA: Diagnosis not present

## 2015-04-28 DIAGNOSIS — D62 Acute posthemorrhagic anemia: Secondary | ICD-10-CM | POA: Diagnosis not present

## 2015-04-28 DIAGNOSIS — D72829 Elevated white blood cell count, unspecified: Secondary | ICD-10-CM | POA: Diagnosis present

## 2015-04-28 DIAGNOSIS — L8990 Pressure ulcer of unspecified site, unspecified stage: Secondary | ICD-10-CM | POA: Diagnosis not present

## 2015-04-28 DIAGNOSIS — T84091A Other mechanical complication of internal left hip prosthesis, initial encounter: Secondary | ICD-10-CM | POA: Diagnosis present

## 2015-04-28 DIAGNOSIS — M25552 Pain in left hip: Secondary | ICD-10-CM | POA: Diagnosis present

## 2015-04-28 DIAGNOSIS — I82492 Acute embolism and thrombosis of other specified deep vein of left lower extremity: Secondary | ICD-10-CM | POA: Diagnosis present

## 2015-04-28 HISTORY — PX: HIP ARTHROPLASTY: SHX981

## 2015-04-28 LAB — POCT I-STAT 4, (NA,K, GLUC, HGB,HCT)
Glucose, Bld: 115 mg/dL — ABNORMAL HIGH (ref 65–99)
HCT: 28 % — ABNORMAL LOW (ref 36.0–46.0)
Hemoglobin: 9.5 g/dL — ABNORMAL LOW (ref 12.0–15.0)
Potassium: 4.7 mmol/L (ref 3.5–5.1)
SODIUM: 140 mmol/L (ref 135–145)

## 2015-04-28 LAB — SURGICAL PCR SCREEN
MRSA, PCR: NEGATIVE
Staphylococcus aureus: POSITIVE — AB

## 2015-04-28 LAB — ABO/RH: ABO/RH(D): O POS

## 2015-04-28 SURGERY — HEMIARTHROPLASTY, HIP, DIRECT ANTERIOR APPROACH, FOR FRACTURE
Anesthesia: General | Site: Hip | Laterality: Left

## 2015-04-28 MED ORDER — BUPIVACAINE-EPINEPHRINE (PF) 0.5% -1:200000 IJ SOLN
INTRAMUSCULAR | Status: AC
  Filled 2015-04-28: qty 30

## 2015-04-28 MED ORDER — HYDROMORPHONE HCL 1 MG/ML IJ SOLN
0.2500 mg | INTRAMUSCULAR | Status: DC | PRN
  Administered 2015-04-28: 0.25 mg via INTRAVENOUS

## 2015-04-28 MED ORDER — FENTANYL CITRATE (PF) 250 MCG/5ML IJ SOLN
INTRAMUSCULAR | Status: AC
Start: 2015-04-28 — End: 2015-04-28
  Filled 2015-04-28: qty 5

## 2015-04-28 MED ORDER — CEFAZOLIN SODIUM-DEXTROSE 2-3 GM-% IV SOLR
2.0000 g | INTRAVENOUS | Status: AC
  Administered 2015-04-28: 2 g via INTRAVENOUS

## 2015-04-28 MED ORDER — PROPOFOL 10 MG/ML IV BOLUS
INTRAVENOUS | Status: DC | PRN
  Administered 2015-04-28: 120 mg via INTRAVENOUS

## 2015-04-28 MED ORDER — DEXTROSE-NACL 5-0.45 % IV SOLN: INTRAVENOUS | Status: DC

## 2015-04-28 MED ORDER — ASPIRIN EC 325 MG PO TBEC
325.0000 mg | DELAYED_RELEASE_TABLET | Freq: Two times a day (BID) | ORAL | Status: DC
Start: 2015-04-28 — End: 2015-05-01

## 2015-04-28 MED ORDER — MORPHINE SULFATE (PF) 2 MG/ML IV SOLN
1.0000 mg | INTRAVENOUS | Status: DC | PRN
  Administered 2015-04-29: 2 mg via INTRAVENOUS
  Filled 2015-04-28: qty 1

## 2015-04-28 MED ORDER — ZOLPIDEM TARTRATE 5 MG PO TABS: 5.0000 mg | ORAL_TABLET | Freq: Every evening | ORAL | Status: DC | PRN

## 2015-04-28 MED ORDER — FENTANYL CITRATE (PF) 250 MCG/5ML IJ SOLN
INTRAMUSCULAR | Status: DC | PRN
  Administered 2015-04-28 (×2): 100 ug via INTRAVENOUS
  Administered 2015-04-28: 50 ug via INTRAVENOUS
  Administered 2015-04-28: 100 ug via INTRAVENOUS
  Administered 2015-04-28 (×3): 50 ug via INTRAVENOUS

## 2015-04-28 MED ORDER — BACLOFEN 10 MG PO TABS: 10.0000 mg | ORAL_TABLET | Freq: Three times a day (TID) | ORAL | Status: DC | PRN

## 2015-04-28 MED ORDER — DEXAMETHASONE SODIUM PHOSPHATE 4 MG/ML IJ SOLN
INTRAMUSCULAR | Status: AC
  Filled 2015-04-28: qty 1

## 2015-04-28 MED ORDER — POLYETHYLENE GLYCOL 3350 17 G PO PACK: 17.0000 g | PACK | Freq: Every day | ORAL | Status: DC | PRN

## 2015-04-28 MED ORDER — HYDROMORPHONE HCL 1 MG/ML IJ SOLN
INTRAMUSCULAR | Status: AC
  Filled 2015-04-28: qty 1

## 2015-04-28 MED ORDER — ONDANSETRON HCL 4 MG/2ML IJ SOLN: 4.0000 mg | Freq: Four times a day (QID) | INTRAMUSCULAR | Status: DC | PRN

## 2015-04-28 MED ORDER — 0.9 % SODIUM CHLORIDE (POUR BTL) OPTIME
TOPICAL | Status: DC | PRN
  Administered 2015-04-28: 1000 mL

## 2015-04-28 MED ORDER — ROCURONIUM BROMIDE 50 MG/5ML IV SOLN
INTRAVENOUS | Status: AC
  Filled 2015-04-28: qty 1

## 2015-04-28 MED ORDER — ONDANSETRON HCL 4 MG/2ML IJ SOLN
INTRAMUSCULAR | Status: DC | PRN
  Administered 2015-04-28: 4 mg via INTRAVENOUS

## 2015-04-28 MED ORDER — DEXTROSE 5 % IV SOLN
500.0000 mg | Freq: Four times a day (QID) | INTRAVENOUS | Status: DC | PRN
  Filled 2015-04-28: qty 5

## 2015-04-28 MED ORDER — CEFAZOLIN SODIUM-DEXTROSE 2-3 GM-% IV SOLR
2.0000 g | Freq: Four times a day (QID) | INTRAVENOUS | Status: AC
Start: 2015-04-28 — End: 2015-04-29
  Administered 2015-04-28 – 2015-04-29 (×2): 2 g via INTRAVENOUS
  Filled 2015-04-28 (×3): qty 50

## 2015-04-28 MED ORDER — CHLORHEXIDINE GLUCONATE 4 % EX LIQD: 60.0000 mL | Freq: Once | CUTANEOUS | Status: DC

## 2015-04-28 MED ORDER — PHENYLEPHRINE HCL 10 MG/ML IJ SOLN
INTRAMUSCULAR | Status: DC | PRN
  Administered 2015-04-28 (×2): 100 ug via INTRAVENOUS
  Administered 2015-04-28: 50 ug via INTRAVENOUS

## 2015-04-28 MED ORDER — LIDOCAINE HCL (CARDIAC) 20 MG/ML IV SOLN
INTRAVENOUS | Status: AC
  Filled 2015-04-28: qty 5

## 2015-04-28 MED ORDER — HYDROCODONE-ACETAMINOPHEN 5-325 MG PO TABS
1.0000 | ORAL_TABLET | Freq: Four times a day (QID) | ORAL | Status: DC | PRN
  Administered 2015-04-28 – 2015-04-29 (×3): 2 via ORAL
  Administered 2015-04-29 – 2015-04-30 (×3): 1 via ORAL
  Administered 2015-04-30 – 2015-05-01 (×3): 2 via ORAL
  Filled 2015-04-28: qty 1
  Filled 2015-04-28: qty 2
  Filled 2015-04-28: qty 1
  Filled 2015-04-28 (×2): qty 2
  Filled 2015-04-28: qty 1
  Filled 2015-04-28 (×4): qty 2

## 2015-04-28 MED ORDER — OXYCODONE HCL 5 MG/5ML PO SOLN: 5.0000 mg | Freq: Once | ORAL | Status: DC | PRN

## 2015-04-28 MED ORDER — OXYCODONE HCL 5 MG PO TABS: 5.0000 mg | ORAL_TABLET | Freq: Once | ORAL | Status: DC | PRN

## 2015-04-28 MED ORDER — DEXAMETHASONE SODIUM PHOSPHATE 4 MG/ML IJ SOLN
INTRAMUSCULAR | Status: DC | PRN
  Administered 2015-04-28: 4 mg via INTRAVENOUS

## 2015-04-28 MED ORDER — HEPARIN SODIUM (PORCINE) 5000 UNIT/ML IJ SOLN
5000.0000 [IU] | Freq: Once | INTRAMUSCULAR | Status: AC
  Administered 2015-04-28: 5000 [IU] via SUBCUTANEOUS
  Filled 2015-04-28: qty 1

## 2015-04-28 MED ORDER — ALUM & MAG HYDROXIDE-SIMETH 200-200-20 MG/5ML PO SUSP
30.0000 mL | ORAL | Status: DC | PRN
  Administered 2015-04-29: 30 mL via ORAL
  Filled 2015-04-28: qty 30

## 2015-04-28 MED ORDER — LABETALOL HCL 5 MG/ML IV SOLN
INTRAVENOUS | Status: AC
  Filled 2015-04-28: qty 4

## 2015-04-28 MED ORDER — LIDOCAINE HCL (CARDIAC) 20 MG/ML IV SOLN
INTRAVENOUS | Status: DC | PRN
  Administered 2015-04-28: 30 mg via INTRATRACHEAL

## 2015-04-28 MED ORDER — PHENYLEPHRINE 40 MCG/ML (10ML) SYRINGE FOR IV PUSH (FOR BLOOD PRESSURE SUPPORT)
PREFILLED_SYRINGE | INTRAVENOUS | Status: AC
  Filled 2015-04-28: qty 10

## 2015-04-28 MED ORDER — ACETAMINOPHEN 325 MG PO TABS
650.0000 mg | ORAL_TABLET | Freq: Four times a day (QID) | ORAL | Status: DC | PRN
  Administered 2015-04-29 – 2015-04-30 (×2): 650 mg via ORAL
  Filled 2015-04-28 (×2): qty 2

## 2015-04-28 MED ORDER — CEFAZOLIN SODIUM-DEXTROSE 2-3 GM-% IV SOLR
INTRAVENOUS | Status: AC
  Filled 2015-04-28: qty 50

## 2015-04-28 MED ORDER — PROPOFOL 10 MG/ML IV BOLUS
INTRAVENOUS | Status: AC
  Filled 2015-04-28: qty 20

## 2015-04-28 MED ORDER — BUPIVACAINE-EPINEPHRINE 0.5% -1:200000 IJ SOLN
INTRAMUSCULAR | Status: DC | PRN
  Administered 2015-04-28: 20 mL

## 2015-04-28 MED ORDER — LABETALOL HCL 5 MG/ML IV SOLN
INTRAVENOUS | Status: DC | PRN
  Administered 2015-04-28 (×3): 5 mg via INTRAVENOUS

## 2015-04-28 MED ORDER — SODIUM CHLORIDE 0.9 % IR SOLN
Status: DC | PRN
  Administered 2015-04-28: 3000 mL

## 2015-04-28 MED ORDER — METHOCARBAMOL 500 MG PO TABS
500.0000 mg | ORAL_TABLET | Freq: Four times a day (QID) | ORAL | Status: DC | PRN
  Administered 2015-04-29 – 2015-04-30 (×4): 500 mg via ORAL
  Filled 2015-04-28 (×4): qty 1

## 2015-04-28 MED ORDER — PANTOPRAZOLE SODIUM 20 MG PO TBEC: 20.0000 mg | DELAYED_RELEASE_TABLET | Freq: Every day | ORAL | Status: DC

## 2015-04-28 MED ORDER — BISACODYL 5 MG PO TBEC
5.0000 mg | DELAYED_RELEASE_TABLET | Freq: Every day | ORAL | Status: DC | PRN
  Administered 2015-04-30: 5 mg via ORAL
  Filled 2015-04-28: qty 1

## 2015-04-28 MED ORDER — FENTANYL CITRATE (PF) 250 MCG/5ML IJ SOLN
INTRAMUSCULAR | Status: AC
  Filled 2015-04-28: qty 5

## 2015-04-28 MED ORDER — MORPHINE SULFATE (PF) 2 MG/ML IV SOLN: 0.5000 mg | INTRAVENOUS | Status: DC | PRN

## 2015-04-28 MED ORDER — HYDROCODONE-ACETAMINOPHEN 5-325 MG PO TABS: 1.0000 | ORAL_TABLET | Freq: Four times a day (QID) | ORAL | Status: DC | PRN

## 2015-04-28 MED ORDER — HEPARIN SODIUM (PORCINE) 5000 UNIT/ML IJ SOLN: 5000.0000 [IU] | Freq: Once | INTRAMUSCULAR | Status: DC

## 2015-04-28 MED ORDER — ASPIRIN EC 325 MG PO TBEC
325.0000 mg | DELAYED_RELEASE_TABLET | Freq: Two times a day (BID) | ORAL | Status: DC
  Administered 2015-04-29 (×2): 325 mg via ORAL
  Filled 2015-04-28 (×3): qty 1

## 2015-04-28 MED ORDER — ONDANSETRON HCL 4 MG/2ML IJ SOLN: 4.0000 mg | Freq: Once | INTRAMUSCULAR | Status: DC | PRN

## 2015-04-28 MED ORDER — ROCURONIUM BROMIDE 100 MG/10ML IV SOLN
INTRAVENOUS | Status: DC | PRN
  Administered 2015-04-28: 30 mg via INTRAVENOUS

## 2015-04-28 MED ORDER — LACTATED RINGERS IV SOLN
INTRAVENOUS | Status: DC | PRN
  Administered 2015-04-28 (×3): via INTRAVENOUS

## 2015-04-28 MED ORDER — ACETAMINOPHEN 650 MG RE SUPP
650.0000 mg | Freq: Four times a day (QID) | RECTAL | Status: DC | PRN
Start: 2015-04-28 — End: 2015-05-01

## 2015-04-28 MED ORDER — DOCUSATE SODIUM 100 MG PO CAPS
100.0000 mg | ORAL_CAPSULE | Freq: Two times a day (BID) | ORAL | Status: DC
  Administered 2015-04-29 – 2015-04-30 (×4): 100 mg via ORAL
  Filled 2015-04-28 (×4): qty 1

## 2015-04-28 MED ORDER — FERROUS SULFATE 325 (65 FE) MG PO TABS
325.0000 mg | ORAL_TABLET | Freq: Every day | ORAL | Status: DC
  Administered 2015-04-29 – 2015-05-01 (×3): 325 mg via ORAL
  Filled 2015-04-28 (×3): qty 1

## 2015-04-28 MED ORDER — ONDANSETRON HCL 4 MG PO TABS: 4.0000 mg | ORAL_TABLET | Freq: Four times a day (QID) | ORAL | Status: DC | PRN

## 2015-04-28 SURGICAL SUPPLY — 66 items
BLADE SAW SAG 73X25 THK (BLADE) ×2
BLADE SAW SGTL 73X25 THK (BLADE) ×1 IMPLANT
BRUSH FEMORAL CANAL (MISCELLANEOUS) IMPLANT
CAPT HIP HEMI 2 ×3 IMPLANT
COVER BACK TABLE 24X17X13 BIG (DRAPES) ×3 IMPLANT
COVER MAYO STAND STRL (DRAPES) ×3 IMPLANT
DRAPE C-ARM 42X72 X-RAY (DRAPES) ×3 IMPLANT
DRAPE IMP U-DRAPE 54X76 (DRAPES) ×6 IMPLANT
DRAPE ORTHO SPLIT 77X108 STRL (DRAPES) ×6
DRAPE PROXIMA HALF (DRAPES) ×6 IMPLANT
DRAPE SURG ORHT 6 SPLT 77X108 (DRAPES) ×3 IMPLANT
DRAPE U-SHAPE 47X51 STRL (DRAPES) ×3 IMPLANT
DRILL BIT 7/64X5 (BIT) ×3 IMPLANT
DRSG AQUACEL AG ADV 3.5X14 (GAUZE/BANDAGES/DRESSINGS) ×3 IMPLANT
DRSG PAD ABDOMINAL 8X10 ST (GAUZE/BANDAGES/DRESSINGS) IMPLANT
DURAPREP 26ML APPLICATOR (WOUND CARE) ×6 IMPLANT
ELECT BLADE 6.5 EXT (BLADE) IMPLANT
ELECT CAUTERY BLADE 6.4 (BLADE) ×3 IMPLANT
ELECT REM PT RETURN 9FT ADLT (ELECTROSURGICAL) ×3
ELECTRODE REM PT RTRN 9FT ADLT (ELECTROSURGICAL) ×1 IMPLANT
EVACUATOR 1/8 PVC DRAIN (DRAIN) IMPLANT
FACESHIELD WRAPAROUND (MASK) ×3 IMPLANT
GAUZE SPONGE 4X4 12PLY STRL (GAUZE/BANDAGES/DRESSINGS) IMPLANT
GAUZE XEROFORM 5X9 LF (GAUZE/BANDAGES/DRESSINGS) IMPLANT
GLOVE BIOGEL PI IND STRL 6.5 (GLOVE) ×3 IMPLANT
GLOVE BIOGEL PI IND STRL 7.0 (GLOVE) ×2 IMPLANT
GLOVE BIOGEL PI IND STRL 8 (GLOVE) ×2 IMPLANT
GLOVE BIOGEL PI INDICATOR 6.5 (GLOVE) ×6
GLOVE BIOGEL PI INDICATOR 7.0 (GLOVE) ×4
GLOVE BIOGEL PI INDICATOR 8 (GLOVE) ×4
GLOVE ECLIPSE 6.5 STRL STRAW (GLOVE) ×6 IMPLANT
GLOVE ECLIPSE 7.5 STRL STRAW (GLOVE) ×6 IMPLANT
GLOVE SKINSENSE NS SZ6.5 (GLOVE) ×2
GLOVE SKINSENSE STRL SZ6.5 (GLOVE) ×1 IMPLANT
GOWN STRL REUS W/ TWL LRG LVL3 (GOWN DISPOSABLE) ×2 IMPLANT
GOWN STRL REUS W/ TWL XL LVL3 (GOWN DISPOSABLE) ×2 IMPLANT
GOWN STRL REUS W/TWL LRG LVL3 (GOWN DISPOSABLE) ×4
GOWN STRL REUS W/TWL XL LVL3 (GOWN DISPOSABLE) ×4
HANDPIECE INTERPULSE COAX TIP (DISPOSABLE) ×2
IMMOBILIZER KNEE 20 (SOFTGOODS) IMPLANT
KIT BASIN OR (CUSTOM PROCEDURE TRAY) ×3 IMPLANT
KIT ROOM TURNOVER OR (KITS) ×3 IMPLANT
MANIFOLD NEPTUNE II (INSTRUMENTS) ×3 IMPLANT
NEEDLE 1/2 CIR MAYO (NEEDLE) IMPLANT
NS IRRIG 1000ML POUR BTL (IV SOLUTION) ×3 IMPLANT
PACK TOTAL JOINT (CUSTOM PROCEDURE TRAY) ×3 IMPLANT
PACK UNIVERSAL I (CUSTOM PROCEDURE TRAY) IMPLANT
PAD ARMBOARD 7.5X6 YLW CONV (MISCELLANEOUS) ×9 IMPLANT
PASSER SUT SWANSON 36MM LOOP (INSTRUMENTS) IMPLANT
SET HNDPC FAN SPRY TIP SCT (DISPOSABLE) ×1 IMPLANT
STAPLER VISISTAT 35W (STAPLE) ×3 IMPLANT
SUCTION FRAZIER HANDLE 10FR (MISCELLANEOUS)
SUCTION TUBE FRAZIER 10FR DISP (MISCELLANEOUS) IMPLANT
SUT ETHIBOND 2 V 37 (SUTURE) ×3 IMPLANT
SUT PASSER 2.0 195M (MISCELLANEOUS) ×3 IMPLANT
SUT VIC AB 0 CT1 27 (SUTURE) ×4
SUT VIC AB 0 CT1 27XBRD ANBCTR (SUTURE) ×2 IMPLANT
SUT VIC AB 1 CTX 36 (SUTURE) ×4
SUT VIC AB 1 CTX36XBRD ANBCTR (SUTURE) ×2 IMPLANT
SUT VIC AB 2-0 CT1 36 (SUTURE) ×6 IMPLANT
SYR CONTROL 10ML LL (SYRINGE) ×3 IMPLANT
TOWEL OR 17X24 6PK STRL BLUE (TOWEL DISPOSABLE) ×3 IMPLANT
TOWEL OR 17X26 10 PK STRL BLUE (TOWEL DISPOSABLE) ×3 IMPLANT
TOWER CARTRIDGE SMART MIX (DISPOSABLE) IMPLANT
TRAY FOLEY CATH 16FRSI W/METER (SET/KITS/TRAYS/PACK) ×3 IMPLANT
WATER STERILE IRR 1000ML POUR (IV SOLUTION) ×3 IMPLANT

## 2015-04-28 NOTE — Progress Notes (Signed)
Patient seen and examined this morning, she was admitted overnight by Dr. Julian Reil. H&P reviewed.  In brief this is a 82 year old female from [place], who fell about 4 months ago and broke her hip, and she underwent repair he [place] with 3 screws, with persistent associated angulation deformity, patient in severe pain over the last 4 months and unable to ambulate.  Hip fracture - Needs operative revision, Dr. Luiz Blare consulted and plans for operating room today. Patient is fairly active before this, no chest pain, no dyspnea, no prior cardiac or pulmonary history, she is at low risk for peri-residual complications - Monitor CBC postoperatively - Patient is requesting no pork derived products, that includes heparin and Lovenox. Will use Arixtra 2.5 mg daily for DVT prophylaxis, timing with that needs to be started is per orthopedic surgery   Heart murmur - We'll order a 2-D echo, however its completion should not hold surgery as clinically she has no cardiac symptoms  Leukocytosis - Likely reactive  Merrillyn Ackerley M. Elvera Lennox, MD [facility] [phone]

## 2015-04-28 NOTE — Progress Notes (Signed)
The patient is scheduled for left hip surgery today.  See Dr. Luiz Blare note for details.  We'll get   this done this afternoon.

## 2015-04-28 NOTE — Progress Notes (Signed)
Initial Nutrition Assessment  INTERVENTION:  Supplement diet once advanced.   NUTRITION DIAGNOSIS:   Increased nutrient needs related to wound healing as evidenced by estimated needs.  GOAL:   Patient will meet greater than or equal to 90% of their needs  MONITOR:   Diet advancement, Labs, I & O's, Skin  REASON FOR ASSESSMENT:   Consult Hip fracture protocol  ASSESSMENT:   Amber Fisher is a 82 y.o. female who was in [place] for the past 8 months. 4 months ago she fell and broke her hip. Unfortunately resources are poor in [place] and the repair that was attempted at that time was done only with screws. Patient has been having severe hip pain ever since then with minimal ambulation. She was barely able to ambulate with walker to get off of the plane today (though her son feared that they wouldn't let her in to the country if she was in a wheel chair). Pt does not speak [place], both sons speak [place].   Pt in OR for surgery.  Unable to complete Nutrition-Focused physical exam at this time.   Diet Order:  Diet NPO time specified  Skin:  Wound (see comment) (stage I left heel)  Last BM:  unknown  Height:   Ht Readings from Last 1 Encounters:  No data found for Ht   Weight:   Wt Readings from Last 1 Encounters:  04/28/15 134 lb 0.3 oz (60.791 kg)   Ideal Body Weight:    unknown   BMI:  There is no height on file to calculate BMI.  Estimated Nutritional Needs:   Kcal:  1700-1900  Protein:  85-100 grams  Fluid:  >1.7 L/day  EDUCATION NEEDS:   No education needs identified at this time  Amber Fisher RD, LDN, CNSC [phone] Pager [phone] After Hours Pager

## 2015-04-28 NOTE — Consult Note (Signed)
Reason for Consult:fractured left hip treated iat an outside facility with failure Referring Physician: hospitalists  Amber Fisher is an 82 y.o. female.  HPI: the patient is a 82 year old female who was treated in [place] for femoral neck fracture.  She has been unable to ambulate with any significance over the last 8 months since her treatment.  She's not had fevers or chills but has had significant pain in the hip.  She flew home today and came directly to the emergency room.  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  We are consult for management of this nonunion of the femoral fracture treated in [place].  The patient has been relatively healthy prior to this admission.  She is with her son who speaks very good [place].the patient does not speak [place].  History reviewed. No pertinent past medical history.  Past Surgical History  Procedure Laterality Date  . Hip surgery      No family history on file.  Social History:  reports that she has never smoked. She does not have any smokeless tobacco history on file. She reports that she does not drink alcohol or use illicit drugs.  Allergies: No Known Allergies  Medications: I have reviewed the patient's current medications.  Results for orders placed or performed during the hospital encounter of 04/27/15 (from the past 48 hour(s))  D-dimer, quantitative (not at [facility])     Status: Abnormal   Collection Time: 04/27/15  9:33 PM  Result Value Ref Range   D-Dimer, Quant 3.07 (H) 0.00 - 0.50 ug/mL-FEU    Comment: (NOTE) At the manufacturer cut-off of 0.50 ug/mL FEU, this assay has been documented to exclude PE with a sensitivity and negative predictive value of 97 to 99%.  At this time, this assay has not been approved by the FDA to exclude DVT/VTE. Results should be correlated with clinical presentation.   CBC with Differential/Platelet     Status: Abnormal   Collection Time: 04/27/15  9:33 PM   Result Value Ref Range   WBC 11.4 (H) 4.0 - 10.5 K/uL    Comment: WHITE COUNT CONFIRMED ON SMEAR   RBC 3.73 (L) 3.87 - 5.11 MIL/uL   Hemoglobin 11.3 (L) 12.0 - 15.0 g/dL   HCT 33.4 (L) 36.0 - 46.0 %   MCV 89.5 78.0 - 100.0 fL   MCH 30.3 26.0 - 34.0 pg   MCHC 33.8 30.0 - 36.0 g/dL   RDW 17.7 (H) 11.5 - 15.5 %   Platelets 332 150 - 400 K/uL    Comment: PLATELET COUNT CONFIRMED BY SMEAR   Neutrophils Relative % 47 %   Lymphocytes Relative 39 %   Monocytes Relative 9 %   Eosinophils Relative 5 %   Basophils Relative 0 %   Neutro Abs 5.4 1.7 - 7.7 K/uL   Lymphs Abs 4.4 (H) 0.7 - 4.0 K/uL   Monocytes Absolute 1.0 0.1 - 1.0 K/uL   Eosinophils Absolute 0.6 0.0 - 0.7 K/uL   Basophils Absolute 0.0 0.0 - 0.1 K/uL   Smear Review      PLATELET CLUMPS NOTED ON SMEAR, COUNT APPEARS ADEQUATE  Comprehensive metabolic panel     Status: Abnormal   Collection Time: 04/27/15  9:33 PM  Result Value Ref Range   Sodium 139 135 - 145 mmol/L   Potassium 3.8 3.5 - 5.1 mmol/L   Chloride 103 101 - 111 mmol/L   CO2 21 (L) 22 - 32 mmol/L  Glucose, Bld 85 65 - 99 mg/dL   BUN 14 6 - 20 mg/dL   Creatinine, Ser 0.68 0.44 - 1.00 mg/dL   Calcium 9.4 8.9 - 10.3 mg/dL   Total Protein 7.0 6.5 - 8.1 g/dL   Albumin 3.0 (L) 3.5 - 5.0 g/dL   AST 23 15 - 41 U/L   ALT 15 14 - 54 U/L   Alkaline Phosphatase 83 38 - 126 U/L   Total Bilirubin 0.5 0.3 - 1.2 mg/dL   GFR calc non Af Amer >60 >60 mL/min   GFR calc Af Amer >60 >60 mL/min    Comment: (NOTE) The eGFR has been calculated using the CKD EPI equation. This calculation has not been validated in all clinical situations. eGFR's persistently <60 mL/min signify possible Chronic Kidney Disease.    Anion gap 15 5 - 15  Urinalysis, Routine w reflex microscopic (not at [facility])     Status: Abnormal   Collection Time: 04/27/15  9:51 PM  Result Value Ref Range   Color, Urine YELLOW YELLOW   APPearance CLEAR CLEAR   Specific Gravity, Urine 1.020 1.005 - 1.030   pH  5.0 5.0 - 8.0   Glucose, UA NEGATIVE NEGATIVE mg/dL   Hgb urine dipstick NEGATIVE NEGATIVE   Bilirubin Urine NEGATIVE NEGATIVE   Ketones, ur NEGATIVE NEGATIVE mg/dL   Protein, ur NEGATIVE NEGATIVE mg/dL   Nitrite NEGATIVE NEGATIVE   Leukocytes, UA SMALL (A) NEGATIVE  Urine microscopic-add on     Status: Abnormal   Collection Time: 04/27/15  9:51 PM  Result Value Ref Range   Squamous Epithelial / LPF 0-5 (A) NONE SEEN   WBC, UA 6-30 0 - 5 WBC/hpf   RBC / HPF 0-5 0 - 5 RBC/hpf   Bacteria, UA RARE (A) NONE SEEN   Urine-Other MUCOUS PRESENT     Dg Hip Unilat With Pelvis 2-3 Views Left  04/27/2015  CLINICAL DATA:  Recent left hip surgery 5 months ago, pain ever since in left hip. Very limited range of motion. EXAM: DG HIP (WITH OR WITHOUT PELVIS) 2-3V LEFT COMPARISON:  None. FINDINGS: Three fixation screws traverse a subcapital left femoral neck fracture site. There is a persistent diastasis at the fracture site which measures approximately 9 mm superiorly and perhaps up to 2 cm centrally. Associated angulation deformity at the fracture site. Femoral head remains grossly well positioned relative to the acetabulum. Osseous structures of the pelvis appear intact and well aligned throughout, although diffuse osteopenia limits characterization of osseous detail. Mild degenerative change noted within the lower lumbar spine. Soft tissues about the pelvis and left hip are unremarkable. IMPRESSION: 1. Status post screw fixation of a subcapital left femoral neck fracture. Fairly marked diastasis persists at the fracture site, measuring 9 mm superiorly and up to approximately 2 cm centrally. Associated angulation deformity. Femoral head remains normally positioned relative to the acetabulum. Would consider surgical consult for possible revision. 2. No acute abnormality appreciated within the osseous pelvis. Electronically Signed   By: Franki Cabot M.D.   On: 04/27/2015 17:02    ROS  ROS: I have reviewed the  patient's review of systems thoroughly and there are no positive responses as relates to the HPI. EXAM: Blood pressure 108/52, pulse 86, temperature 98.1 F (36.7 C), temperature source Oral, resp. rate 14, SpO2 98 %. Physical Exam Well-developed well-nourished patient in no acute distress. Alert and oriented x3 HEENT:within normal limits Cardiac: Regular rate and rhythm Pulmonary: Lungs clear to auscultation Abdomen: Soft and  nontender.  Normal active bowel sounds  Musculoskeletal: (left hip: Well-healed long wound.  No warmth or erythema.  Significant pain with range of motion left hip. Assessment/Plan: 82 year old female with history of femoral neck fracture and surgery 5 months ago.  She has not done well since surgery.  X-ray show that she has a nonunited femoral neck fracture with  Possible screw penetration into the acetabulum.//Had a long discussion with she and her family tonight about her issues.  She needs hemiarthroplasty versus total hip replacement if the acetabulum has been significantly injured by this screw.  There is significant potential risk that she could have an infection.  There is significant potential risk that she could have a fracture of the greater trochanter intraoperatively.  I discussed all these potential risk with the family at length and they certainly are understanding of them.  She will not do well without surgical intervention.  I will plan on taking her to the operating room for left hemiarthroplasty and I will use a cemented technique.  She may need ORIF of the trochanter if she fractures.  She obviously will need removal of her hardware.I have had a prolonged discussion with the patient regarding the risk and benefits of the surgical procedure.  The patient understands the risks include but are not limited to bleeding, infection and failure of the surgery to cure the problem and need for further surgery.  The patient understands there is a slight risk of death at  the time of surgery.  The patient understands these risks along with the potential benefits and wishes to proceed with surgical intervention. The patient will be followed in the office in the postoperative period.  GRAVES,JOHN L 04/28/2015, 1:33 AM

## 2015-04-28 NOTE — Brief Op Note (Signed)
04/27/2015 - 04/28/2015  6:10 PM  PATIENT:  Nichola Sizer  82 y.o. female  PRE-OPERATIVE DIAGNOSIS:  FRACTURED LEFT HIP  POST-OPERATIVE DIAGNOSIS:  FRACTURED LEFT HIP  PROCEDURE:  Procedure(s): ARTHROPLASTY BIPOLAR HIP (HEMIARTHROPLASTY  WITH HARDWARE REMOVAL,  (Left)  SURGEON:  Surgeon(s) and Role:    * Jodi Geralds, MD - Primary  PHYSICIAN ASSISTANT:   ASSISTANTS: bethune   ANESTHESIA:   general  EBL:  Total I/O In: 2200 [I.V.:2200] Out: 990 [Urine:190; Blood:800]  BLOOD ADMINISTERED:none  DRAINS: none   LOCAL MEDICATIONS USED:  MARCAINE     SPECIMEN:  No Specimen  DISPOSITION OF SPECIMEN:  N/A  COUNTS:  YES  TOURNIQUET:  * No tourniquets in log *  DICTATION: .Other Dictation: Dictation Number [id number]  PLAN OF CARE: Admit to inpatient   PATIENT DISPOSITION:  PACU - hemodynamically stable.   Delay start of Pharmacological VTE agent (>24hrs) due to surgical blood loss or risk of bleeding: no

## 2015-04-28 NOTE — Op Note (Signed)
Amber Fisher, KATICH NO.:  [id number]  MEDICAL RECORD NO.:  [id number]  LOCATION:  5N08C                        FACILITY:  [facility]  PHYSICIAN:  Harvie Junior, M.D.   DATE OF BIRTH:  1933/10/14  DATE OF PROCEDURE:  04/28/2015 DATE OF DISCHARGE:                              OPERATIVE REPORT   PREOPERATIVE DIAGNOSIS:  Failed fixation, left hip with multiple screws for femoral neck fracture.  POSTOPERATIVE DIAGNOSIS:  Failed fixation, left hip with multiple screws for femoral neck fracture.  PROCEDURE: 1. Left hemiarthroplasty with a Corail size 11 stem, a 48 mm -1.5 mm     hip ball. 2. Removal of screws at the site of the hip. 3. Interpretation of multiple intraoperative fluoroscopic images.  SURGEON:  Harvie Junior, M.D.  BRIEF HISTORY:  [name] is a 82 year old female who arrived in [place] just prior to her presentation in the emergency room.  She unfortunately had a fall about 5 months ago in [place] and underwent some sort of screw fixation for her left hip.  She never did well postsurgically and had increasing pain recently.  Because of failure of her surgical intervention and increasing pain, she was brought from [place] to [place] to visit her son, and he ultimately brought her directly immediately to the emergency room.  X-ray imaging that time showed she had a completely displaced femoral neck fracture with screws going across the fracture site, but there was a gap of about 1 cm there. We had a long discussion of treatment options, but felt that removal of the hardware and hemiarthroplasty would be appropriate.  We certainly had concerns that she could have an infection.  We had concerns that the screws that were poking into her hip but have caused her to have acetabular wear and a total hip replacement might be appropriate.  I discussed all this with her son.  It was difficult to discuss with her. She does not speak [place]; but after  this thorough discussion, it was elected to take her to the operating room for fixation.  She was cleared to Internal Medicine.  DESCRIPTION OF PROCEDURE:  The patient was taken to the operating room. After adequate level of anesthesia was obtained with general anesthetic, the patient was placed supine on the operating table.  She was then moved into the right lateral decubitus position.  Attention was then turned to the left hip where a somewhat lateral incision had been made previously.  We felt like we could not make a new incision, so we had to take that incision.  I felt like we need to take that incision and swing it posteriorly, but it was going to put [place] more anterior than we wanted it to be for a posterior approach to the hip.  After routine prep and drape, an incision was made for a posterior approach to the hip while incorporating her old incision, then curving around posteriorly, dissected down to the iliotibial band which was identified and opened, and the vastus lateralis could be identified below.  Planes were created anterior-and-posterior for use of a Charnley.  The Charnley was put in place.  It was very difficult  to assess what was going on to the back of her hip.  We put retractors in place as best we could superior and inferior to the neck, took down the capsule and the piriformis as a group and tagged the structures.  At this point, we attempted to move the head but it fragmented multiple times.  So, we took the head out in multiple pieces, some of these on the back wall on the back table, and as best we could, we tried to get a measurement that appeared to be 47 mm or 48 mm; and at that point, we took a 48 mm head and put it into the socket that seemed to get a nice suction fit.  We thought this was appropriate, and 47 appeared to be a bit small; so, at this point, monopolar 48 was chosen to be used.  Attention was then turned to the stem side where it was a bit of  mess around the femoral neck.  We had to clean it up to try to get back to some bone.  Ultimately, it was very difficult to assess the calcar; and so, we basically just got an intramedullary starting hole and then sequentially rasped this up to a level of 10 broach and ultimately that fit, and we were able to get an 11 broach down.  I thought this gave a bit of a better fit.  We then tested with a +0 48-mm monopolar trial off the broach; and it was clear at that point, we had no idea what we were dealing with in terms of leg length, in terms of any issues related to this case because there was no anatomy that could be identified.  At that point, I made the decision to bring fluoroscopy into the case.  We brought fluoro and with this trial looked to be just right in terms of leg length and offset.  We were very satisfied with that; and once this was completed, we felt like we were in good shape relative to the length and offset.  At this point, the trial broach was removed, and the final Corail stem size 11 was hammered into place, and we got a minus ball with a 48 mm monopolar put this on and then reduced the hip.  Excellent reduction was achieved.  Range of motion was excellent.  Tendency towards instability was not existent. At this point, we repaired the posterior capsule and piriformis to the posterior intertrochanteric line through drill holes; and once that was accomplished, the deep layer was closed with 1 Vicryl running.  There was a bit of scar tissue in this area, but we did close this up and then closed the skin with interrupted nylon and staples.  Sterile compressive dressing was applied, and the patient was taken to recovery room and was noted to be in satisfactory condition.  This was a very complex case relative to the failed union, relative to the lack of knowledge of the patient, and what might potentially be going on at the time of surgery.  There was an hours worth  of preoperative planning and templating going into the case providing for all eventualities such as infection, such as need for total hip, such as fracture of the bone relative to her significant osteoporosis from inactivity over the last 5 months.  Ultimately, we were very happy with the outcome and results.  We will allow her to get up on this leg as quickly as possible, and we will follow  her in the office.  The estimated blood loss for procedure was probably 500 mL, although the final accurate amounts can be gotten from her anesthetic record, and the patient was taken to Recovery and was noted to be in satisfactory condition.     Harvie Junior, M.D.     Ranae Plumber  D:  04/28/2015  T:  04/28/2015  Job:  [id number]

## 2015-04-28 NOTE — Anesthesia Procedure Notes (Signed)
Procedure Name: Intubation Date/Time: 04/28/2015 2:04 PM Performed by: Alanda Amass A Pre-anesthesia Checklist: Patient identified, Timeout performed, Emergency Drugs available, Suction available and Patient being monitored Patient Re-evaluated:Patient Re-evaluated prior to inductionOxygen Delivery Method: Circle system utilized Preoxygenation: Pre-oxygenation with 100% oxygen Intubation Type: IV induction Ventilation: Mask ventilation without difficulty Laryngoscope Size: Glidescope Tube type: Oral Tube size: 7.5 mm Number of attempts: 1 Airway Equipment and Method: Stylet,  Video-laryngoscopy and Bougie stylet Placement Confirmation: ETT inserted through vocal cords under direct vision,  breath sounds checked- equal and bilateral and positive ETCO2 Secured at: 21 cm Tube secured with: Tape Dental Injury: Teeth and Oropharynx as per pre-operative assessment  Difficulty Due To: Difficulty was anticipated Future Recommendations: Recommend- induction with short-acting agent, and alternative techniques readily available Comments: DL with video  Laryngoscope, unable to view vocal cords, head extended and posterior cords seen.  7.5 tube placed at cords, unable to advance ETT.  Coude tip bougie passed through ETT and tube advanced into trachea

## 2015-04-28 NOTE — Anesthesia Preprocedure Evaluation (Addendum)
Anesthesia Evaluation  Patient identified by MRN, date of birth, ID band Patient awake    Reviewed: Allergy & Precautions, NPO status , Patient's Chart, lab work & pertinent test results  Airway Mallampati: III  TM Distance: >3 FB Neck ROM: Full    Dental  (+) Teeth Intact, Dental Advisory Given   Pulmonary    breath sounds clear to auscultation       Cardiovascular  Rhythm:Regular     Neuro/Psych    GI/Hepatic   Endo/Other    Renal/GU      Musculoskeletal   Abdominal   Peds  Hematology   Anesthesia Other Findings   Reproductive/Obstetrics                           Anesthesia Physical Anesthesia Plan  ASA: III  Anesthesia Plan: General   Post-op Pain Management:    Induction: Intravenous  Airway Management Planned: Oral ETT  Additional Equipment:   Intra-op Plan:   Post-operative Plan: Extubation in OR  Informed Consent: I have reviewed the patients History and Physical, chart, labs and discussed the procedure including the risks, benefits and alternatives for the proposed anesthesia with the patient or authorized representative who has indicated his/her understanding and acceptance.   Dental advisory given  Plan Discussed with: CRNA and Anesthesiologist  Anesthesia Plan Comments:         Anesthesia Quick Evaluation

## 2015-04-28 NOTE — Anesthesia Postprocedure Evaluation (Signed)
Anesthesia Post Note  Patient: Amber Fisher  Procedure(s) Performed: Procedure(s) (LRB): ARTHROPLASTY BIPOLAR HIP (HEMIARTHROPLASTY  WITH HARDWARE REMOVAL,  (Left)  Patient location during evaluation: PACU Anesthesia Type: General Level of consciousness: awake and awake and alert Pain management: pain level controlled Vital Signs Assessment: post-procedure vital signs reviewed and stable Respiratory status: spontaneous breathing and nonlabored ventilation Cardiovascular status: blood pressure returned to baseline Anesthetic complications: no    Last Vitals:  Filed Vitals:   04/28/15 1702 04/28/15 1717  BP: 144/59 152/58  Pulse: 75 72  Temp:    Resp: 10 9    Last Pain:  Filed Vitals:   04/28/15 1723  PainSc: Asleep                 Daniella Dewberry COKER

## 2015-04-28 NOTE — Transfer of Care (Signed)
Immediate Anesthesia Transfer of Care Note  Patient: Amber Fisher  Procedure(s) Performed: Procedure(s): ARTHROPLASTY BIPOLAR HIP (HEMIARTHROPLASTY  WITH HARDWARE REMOVAL,  (Left)  Patient Location: PACU  Anesthesia Type:General  Level of Consciousness: sedated  Airway & Oxygen Therapy: Patient Spontanous Breathing and Patient connected to nasal cannula oxygen  Post-op Assessment: Report given to RN and Post -op Vital signs reviewed and stable  Post vital signs: Reviewed and stable  Last Vitals:  Filed Vitals:   04/28/15 0235 04/28/15 0454  BP: 158/58   Pulse: 89 106  Temp: 37 C 37.3 C  Resp: 16 16    Complications: No apparent anesthesia complications

## 2015-04-28 NOTE — Progress Notes (Signed)
Per Son patient requested that son sign consent.

## 2015-04-28 NOTE — H&P (Signed)
[facility] History and Physical  Hadassa Cermak ZOX:[id number] DOB: Mar 21, 1933 DOA: 04/27/2015  Referring physician: EDP PCP: Lonia Blood, MD   Chief Complaint: Hip pain   HPI: Amber Fisher is a 82 y.o. female who was in [place] for the past 8 months.  4 months ago she fell and broke her hip.  Unfortunately resources are poor in [place] and the repair that was attempted at that time was done only with screws.  Patient has been having severe hip pain ever since then with minimal ambulation.  She was barely able to ambulate with walker to get off of the plane today (though her son feared that they wouldn't let her in to the country if she was in a wheel chair).  They came straight from airport to ED.  Patient does not speak English but she is with her sons who speak English fluently and can translate  Imaging reveals attempted screw repair with nonunion with marked diastasis at fracture site and angulation deformity.  Dr.   Review of Systems: Systems reviewed.  As above, otherwise negative  History reviewed. No pertinent past medical history. Past Surgical History  Procedure Laterality Date  . Hip surgery     Social History:  reports that she has never smoked. She does not have any smokeless tobacco history on file. She reports that she does not drink alcohol or use illicit drugs.  No Known Allergies  No family history on file.   Prior to Admission medications   Medication Sig Start Date End Date Taking? Authorizing Provider  OVER THE COUNTER MEDICATION Take by mouth 2 (two) times daily. Vitamin B complex from [facility] - Vitamin B1, B6, B12   Yes Historical Provider, MD  OVER THE COUNTER MEDICATION Take 50 mg by mouth 2 (two) times daily. Diclofenac potassium 50 mg from [facility] (OTC)   Yes Historical Provider, MD  ondansetron (ZOFRAN ODT) 8 MG disintegrating tablet Take 1 tablet (8 mg total) by mouth every 8 (eight) hours as needed for nausea or vomiting. Patient not taking:  Reported on 04/27/2015 05/11/14   Azalia Bilis, MD  pantoprazole (PROTONIX) 20 MG tablet Take 1 tablet (20 mg total) by mouth daily. Patient not taking: Reported on 05/11/2014 03/04/14   Elson Areas, PA-C   Physical Exam: Filed Vitals:   04/28/15 0000 04/28/15 0030  BP: 120/79 108/52  Pulse: 97 86  Temp:    Resp:  14    BP 108/52 mmHg  Pulse 86  Temp(Src) 98.1 F (36.7 C) (Oral)  Resp 14  SpO2 98%  General Appearance:    Alert, oriented, no distress, appears stated age  Head:    Normocephalic, atraumatic  Eyes:    PERRL, EOMI, sclera non-icteric        Nose:   Nares without drainage or epistaxis. Mucosa, turbinates normal  Throat:   Moist mucous membranes. Oropharynx without erythema or exudate.  Neck:   Supple. No carotid bruits.  No thyromegaly.  No lymphadenopathy.   Back:     No CVA tenderness, no spinal tenderness  Lungs:     Clear to auscultation bilaterally, without wheezes, rhonchi or rales  Chest wall:    No tenderness to palpitation  Heart:    Regular rate and rhythm without murmurs, gallops, rubs  Abdomen:     Soft, non-tender, nondistended, normal bowel sounds, no organomegaly  Genitalia:    deferred  Rectal:    deferred  Extremities:   No clubbing, cyanosis or edema.  Pulses:  2+ and symmetric all extremities  Skin:   Skin color, texture, turgor normal, no rashes or lesions  Lymph nodes:   Cervical, supraclavicular, and axillary nodes normal  Neurologic:   CNII-XII intact. Normal strength, sensation and reflexes      throughout    Labs on Admission:  Basic Metabolic Panel:  Recent Labs Lab 04/27/15 2133  NA 139  K 3.8  CL 103  CO2 21*  GLUCOSE 85  BUN 14  CREATININE 0.68  CALCIUM 9.4   Liver Function Tests:  Recent Labs Lab 04/27/15 2133  AST 23  ALT 15  ALKPHOS 83  BILITOT 0.5  PROT 7.0  ALBUMIN 3.0*   No results for input(s): LIPASE, AMYLASE in the last 168 hours. No results for input(s): AMMONIA in the last 168  hours. CBC:  Recent Labs Lab 04/27/15 2133  WBC 11.4*  NEUTROABS 5.4  HGB 11.3*  HCT 33.4*  MCV 89.5  PLT 332   Cardiac Enzymes: No results for input(s): CKTOTAL, CKMB, CKMBINDEX, TROPONINI in the last 168 hours.  BNP (last 3 results) No results for input(s): PROBNP in the last 8760 hours. CBG: No results for input(s): GLUCAP in the last 168 hours.  Radiological Exams on Admission: Dg Hip Unilat With Pelvis 2-3 Views Left  04/27/2015  CLINICAL DATA:  Recent left hip surgery 5 months ago, pain ever since in left hip. Very limited range of motion. EXAM: DG HIP (WITH OR WITHOUT PELVIS) 2-3V LEFT COMPARISON:  None. FINDINGS: Three fixation screws traverse a subcapital left femoral neck fracture site. There is a persistent diastasis at the fracture site which measures approximately 9 mm superiorly and perhaps up to 2 cm centrally. Associated angulation deformity at the fracture site. Femoral head remains grossly well positioned relative to the acetabulum. Osseous structures of the pelvis appear intact and well aligned throughout, although diffuse osteopenia limits characterization of osseous detail. Mild degenerative change noted within the lower lumbar spine. Soft tissues about the pelvis and left hip are unremarkable. IMPRESSION: 1. Status post screw fixation of a subcapital left femoral neck fracture. Fairly marked diastasis persists at the fracture site, measuring 9 mm superiorly and up to approximately 2 cm centrally. Associated angulation deformity. Femoral head remains normally positioned relative to the acetabulum. Would consider surgical consult for possible revision. 2. No acute abnormality appreciated within the osseous pelvis. Electronically Signed   By: Bary Richard M.D.   On: 04/27/2015 17:02    EKG: Independently reviewed.  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 To OR later today  with Dr. Luiz Blare for arthroplasty vs hemiarthroplasty 2. Hip fx pathway 3. Spoke with Dr. Luiz Blare PA: one time dose of heparin [place] now, none in AM.  Plan to start DVT ppx as soon as possible after surgery. 4. No swelling but patient is high risk for DVT given 4 months of nonunion hip fracture followed by very long flight yesterday from [place]  Code Status: Full  Family Communication: Sons at bedside who act as translators Disposition Plan: Admit to inpatient   Time spent: 50 min  Gaberiel Youngblood M. [facility] Pager [phone]  If 7AM-7PM, please contact the day team taking care of the patient [email or web address] Password [email or web address] 04/28/2015, 1:46 AM

## 2015-04-29 ENCOUNTER — Inpatient Hospital Stay (HOSPITAL_COMMUNITY): Payer: Medicare Other

## 2015-04-29 DIAGNOSIS — D62 Acute posthemorrhagic anemia: Secondary | ICD-10-CM

## 2015-04-29 DIAGNOSIS — M79605 Pain in left leg: Secondary | ICD-10-CM

## 2015-04-29 DIAGNOSIS — R05 Cough: Secondary | ICD-10-CM

## 2015-04-29 DIAGNOSIS — L8990 Pressure ulcer of unspecified site, unspecified stage: Secondary | ICD-10-CM

## 2015-04-29 DIAGNOSIS — S72002K Fracture of unspecified part of neck of left femur, subsequent encounter for closed fracture with nonunion: Secondary | ICD-10-CM

## 2015-04-29 LAB — BASIC METABOLIC PANEL
ANION GAP: 15 (ref 5–15)
BUN: 14 mg/dL (ref 6–20)
CHLORIDE: 101 mmol/L (ref 101–111)
CO2: 21 mmol/L — ABNORMAL LOW (ref 22–32)
Calcium: 8.6 mg/dL — ABNORMAL LOW (ref 8.9–10.3)
Creatinine, Ser: 0.78 mg/dL (ref 0.44–1.00)
GFR calc Af Amer: >60 mL/min (ref >60)
GLUCOSE: 144 mg/dL — AB (ref 65–99)
POTASSIUM: 4.3 mmol/L (ref 3.5–5.1)
Sodium: 137 mmol/L (ref 135–145)

## 2015-04-29 LAB — CBC
HEMATOCRIT: 24.9 % — AB (ref 36.0–46.0)
HEMOGLOBIN: 8.1 g/dL — AB (ref 12.0–15.0)
MCH: 28.9 pg (ref 26.0–34.0)
MCHC: 32.5 g/dL (ref 30.0–36.0)
MCV: 88.9 fL (ref 78.0–100.0)
Platelets: 370 10*3/uL (ref 150–400)
RBC: 2.8 MIL/uL — ABNORMAL LOW (ref 3.87–5.11)
RDW: 17.2 % — ABNORMAL HIGH (ref 11.5–15.5)
WBC: 9.9 10*3/uL (ref 4.0–10.5)

## 2015-04-29 LAB — PREPARE RBC (CROSSMATCH)

## 2015-04-29 MED ORDER — FUROSEMIDE 10 MG/ML IJ SOLN
10.0000 mg | Freq: Once | INTRAMUSCULAR | Status: AC
  Administered 2015-04-29: 10 mg via INTRAVENOUS
  Filled 2015-04-29: qty 2

## 2015-04-29 MED ORDER — APIXABAN 5 MG PO TABS: 10.0000 mg | ORAL_TABLET | Freq: Two times a day (BID) | ORAL | Status: DC

## 2015-04-29 MED ORDER — APIXABAN 5 MG PO TABS: 5.0000 mg | ORAL_TABLET | Freq: Two times a day (BID) | ORAL | Status: DC

## 2015-04-29 MED ORDER — DIPHENHYDRAMINE HCL 25 MG PO CAPS
25.0000 mg | ORAL_CAPSULE | Freq: Three times a day (TID) | ORAL | Status: DC | PRN
  Administered 2015-04-29: 25 mg via ORAL
  Filled 2015-04-29: qty 1

## 2015-04-29 MED ORDER — SODIUM CHLORIDE 0.9 % IV SOLN
Freq: Once | INTRAVENOUS | Status: AC
  Administered 2015-04-29: 11:00:00 via INTRAVENOUS

## 2015-04-29 MED ORDER — APIXABAN 5 MG PO TABS
10.0000 mg | ORAL_TABLET | Freq: Two times a day (BID) | ORAL | Status: DC
  Administered 2015-04-29 – 2015-05-01 (×4): 10 mg via ORAL
  Filled 2015-04-29 (×4): qty 2

## 2015-04-29 NOTE — Progress Notes (Signed)
ANTICOAGULATION CONSULT NOTE - Initial Consult  Pharmacy Consult for apixaban Indication: new onset dvt  No Known Allergies  Patient Measurements: Weight: 134 lb 0.3 oz (60.791 kg)  Vital Signs: Temp: 99.1 F (37.3 C) (02/12 1642) Temp Source: Oral (02/12 1642) BP: 139/44 mmHg (02/12 1642) Pulse Rate: 106 (02/12 1642)  Labs:  Recent Labs  04/27/15 2133 04/28/15 1547 04/29/15 0643  HGB 11.3* 9.5* 8.1*  HCT 33.4* 28.0* 24.9*  PLT 332  --  370  CREATININE 0.68  --  0.78    CrCl cannot be calculated (Unknown ideal weight.).  Assessment: 82 yo female broke hip in [place] then returned with infected screws  AC: none pta acute DVT in left posterior and peroneal veins  Renal: Scr 0.78  Heme: H&H 8.1/24.9, Plt 370  Plan:  Apixaban 10 mg BID x 1 week followed by 5 mg BID Monitor for s/sx of bleeding Pt will need education with translator   Isaac Bliss, PharmD, BCPS, [facility] Clinical Pharmacist Pager [phone] 04/29/2015 6:45 PM

## 2015-04-29 NOTE — Progress Notes (Signed)
Utilization review completed.  

## 2015-04-29 NOTE — Progress Notes (Signed)
PROGRESS NOTE  Amber Fisher WUJ:[id number] DOB: 1933-07-22 DOA: 04/27/2015 PCP: Lonia Blood, MD  HPI: In brief this is a 82 year old female from [place], who fell about 4 months ago and broke her hip, and she underwent repair he [place] with 3 screws, with persistent associated angulation deformity, patient in severe pain over the last 4 months and unable to ambulate.  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 Luiz Blare - doing well post op, PT pending - Patient is requesting no pork derived products, that includes heparin and Lovenox. Aspirin per orthopedic surgery   Heart murmur - 2D echo pending - asymptomatic  Leukocytosis - Likely reactive, resolved  Cough - mild, CXR, IS  ABLA - Hb down to 8.1, transfuse 2U pRBC   Diet: Diet regular Room service appropriate?: Yes; Fluid consistency:: Thin Fluids: none DVT Prophylaxis: ASA  Code Status: Full Code Family Communication: d/w son bedside  Disposition Plan: home vs SNF when CBC stable  Barriers to discharge: post op care  Consultants:  Orthopedic surgery   Procedures:  hemiarthroplasty with hardware removal on left 2/11 by Dr. Luiz Blare   Antibiotics Ancef - periop   Studies  Pelvis Portable  04/28/2015  CLINICAL DATA:  Status post left hip hemiarthroplasty EXAM: PORTABLE PELVIS 1-2 VIEWS COMPARISON:  Plain film of the pelvis and left hip dated 04/27/2015. FINDINGS: Interval revision arthroplasty at the left hip. Hardware appears intact and well positioned. No surgical complicating feature seen. Expected postsurgical changes within the adjacent soft tissues. IMPRESSION: Revision  arthroplasty at the left hip. No surgical complicating feature seen. Electronically Signed   By: Bary Richard M.D.   On: 04/28/2015 16:56   Dg C-arm 1-60 Min-no Report  04/28/2015  CLINICAL DATA: left hip fracture C-ARM 1-60 MINUTES Fluoroscopy was utilized by the requesting physician.  No radiographic interpretation.   Dg Hip Unilat With Pelvis 2-3 Views Left  04/27/2015  CLINICAL DATA:  Recent left hip surgery 5 months ago, pain ever since in left hip. Very limited range of motion. EXAM: DG HIP (WITH OR WITHOUT PELVIS) 2-3V LEFT COMPARISON:  None. FINDINGS: Three fixation screws traverse a subcapital left femoral neck fracture site. There is a persistent diastasis at the fracture site which measures approximately 9 mm superiorly and perhaps up to 2 cm centrally. Associated angulation deformity at the fracture site. Femoral head remains grossly well positioned relative to the acetabulum. Osseous structures of the pelvis appear intact and well aligned throughout, although diffuse osteopenia limits characterization of osseous detail. Mild degenerative change noted within the lower lumbar spine. Soft tissues about the pelvis and left hip are unremarkable. IMPRESSION: 1. Status post screw fixation of a subcapital left femoral neck fracture. Fairly marked diastasis persists at the fracture site, measuring 9 mm superiorly and up to approximately 2 cm centrally. Associated angulation deformity. Femoral head remains normally positioned relative to the acetabulum. Would consider surgical consult for possible revision. 2. No acute abnormality appreciated within the osseous pelvis. Electronically Signed   By: Bary Richard M.D.   On: 04/27/2015 17:02    Objective  Filed Vitals:   04/28/15 1732 04/28/15 1920 04/29/15 0022 04/29/15  0628  BP: 151/63 160/61 106/65 91/39  Pulse: 74 84 100 100  Temp: 98.4 F (36.9 C) 97.5 F (36.4 C) 98.1 F (36.7 C) 98.7 F (37.1 C)  TempSrc:  Oral Oral Oral  Resp: Weight:      SpO2: 98% 100% 94% 98%    Intake/Output Summary (Last 24 hours) at 04/29/15 1015 Last data filed at 04/29/15 0600  Gross per 24 hour  Intake 3318.75 ml  Output   1690 ml  Net 1628.75 ml   Filed Weights   04/28/15 0235  Weight: 60.791 kg (134 lb 0.3 oz)    Exam:  GENERAL: NAD  HEENT: no scleral icterus, PERRL  NECK: supple, no LAD  LUNGS: CTA biL, no wheezing  HEART: RRR, 3/6 SEM  ABDOMEN: soft, non tender  EXTREMITIES: no clubbing / cyanosis  NEUROLOGIC: non focal   Data Reviewed: Basic Metabolic Panel:  Recent Labs Lab 04/27/15 2133 04/28/15 1547 04/29/15 0643  NA 139 140 137  K 3.8 4.7 4.3  CL 103  --  101  CO2 21*  --  21*  GLUCOSE 85 115* 144*  BUN 14  --  14  CREATININE 0.68  --  0.78  CALCIUM 9.4  --  8.6*   Liver Function Tests:  Recent Labs Lab 04/27/15 2133  AST 23  ALT 15  ALKPHOS 83  BILITOT 0.5  PROT 7.0  ALBUMIN 3.0*   CBC:  Recent Labs Lab 04/27/15 2133 04/28/15 1547 04/29/15 0643  WBC 11.4*  --  9.9  NEUTROABS 5.4  --   --   HGB 11.3* 9.5* 8.1*  HCT 33.4* 28.0* 24.9*  MCV 89.5  --  88.9  PLT 332  --  370    Recent Results (from the past 240 hour(s))  Surgical pcr screen     Status: Abnormal   Collection Time: 04/28/15  4:51 AM  Result Value Ref Range Status   MRSA, PCR NEGATIVE NEGATIVE Final   Staphylococcus aureus POSITIVE (A) NEGATIVE Final    Comment:        The Xpert SA Assay (FDA approved for NASAL specimens in patients over 18 years of age), is one component of a comprehensive surveillance program.  Test performance has been validated by [facility] for patients greater than or equal to 3 year old. It is not intended to diagnose infection nor to guide or monitor treatment.      Scheduled Meds: . sodium chloride   Intravenous Once  . aspirin EC  325 mg Oral BID PC  . docusate sodium  100 mg Oral BID  . ferrous sulfate  325 mg Oral Q breakfast  . furosemide  10 mg Intravenous  Once   Continuous Infusions:   Pamella Pert, MD [facility] Pager [phone]. If 7 PM - 7 AM, please contact night-coverage at [email or web address], password [email or web address] 04/29/2015, 10:15 AM  LOS: 1 day

## 2015-04-29 NOTE — Progress Notes (Signed)
Subjective: 1 Day Post-Op Procedure(s) (LRB): ARTHROPLASTY BIPOLAR HIP (HEMIARTHROPLASTY  WITH HARDWARE REMOVAL,  (Left) Patient reports pain as moderate. The patient is sitting up in a chair. Her son is at the bedside. Some dizziness.   Objective: Vital signs in last 24 hours: Temp:  [97.5 F (36.4 C)-99.7 F (37.6 C)] 98.7 F (37.1 C) (02/12 0628) Pulse Rate:  [72-100] 100 (02/12 0628) Resp:  [9-24] 16 (02/12 0628) BP: (91-160)/(39-82) 91/39 mmHg (02/12 0628) SpO2:  [94 %-100 %] 98 % (02/12 0628)  Intake/Output from previous day: 02/11 0701 - 02/12 0700 In: 3318.8 [P.O.:440; I.V.:2878.8] Out: 1690 [Urine:890; Blood:800] Intake/Output this shift:     Recent Labs  04/27/15 2133 04/28/15 1547 04/29/15 0643  HGB 11.3* 9.5* 8.1*    Recent Labs  04/27/15 2133 04/28/15 1547 04/29/15 0643  WBC 11.4*  --  9.9  RBC 3.73*  --  2.80*  HCT 33.4* 28.0* 24.9*  PLT 332  --  370    Recent Labs  04/27/15 2133 04/28/15 1547 04/29/15 0643  NA 139 140 137  K 3.8 4.7 4.3  CL 103  --  101  CO2 21*  --  21*  BUN 14  --  14  CREATININE 0.68  --  0.78  GLUCOSE 85 115* 144*  CALCIUM 9.4  --  8.6*   No results for input(s): LABPT, INR in the last 72 hours. Left hip exam: Neurovascular intact Sensation intact distally Intact pulses distally Dorsiflexion/Plantar flexion intact Incision: dressing C/D/I Compartment soft  Assessment/Plan: 1 Day Post-Op Procedure(s) (LRB): ARTHROPLASTY BIPOLAR HIP (HEMIARTHROPLASTY  WITH HARDWARE REMOVAL,  (Left) Acute blood loss anemia. Symptomatic. Plan: Transfuse 2 units of packed RBCs Check CBC in a.m. Continue aspirin 325 mg enteric-coated twice daily for DVT prophylaxis. Treat 1 month postop. Weight-bear as tolerated on left with posterior hip precautions. Will either need skilled nursing facility or home health physical therapy. Up with therapy  Markelle Najarian G 04/29/2015, 9:59 AM

## 2015-04-29 NOTE — Progress Notes (Signed)
VASCULAR LAB PRELIMINARY  PRELIMINARY  PRELIMINARY  PRELIMINARY  Bilateral lower extremity venous duplex completed.    Preliminary report:  There is acute DVT noted in the left posterior tibial and peroneal veins.  All other veins appear thrombus free.   Delonta Yohannes, RVT 04/29/2015, 5:46 PM

## 2015-04-29 NOTE — Evaluation (Signed)
Occupational Therapy Evaluation Patient Details Name: Amber Fisher MRN: [id number] DOB: [birth year] Today's Date: 04/29/2015    History of Present Illness Pt is a 82 y/o F who was living in [place] for the past 8 months, fell and broke her Lt hip in September 2016.  Pt sought care in [place] and screws were placed but pt has been having severe hip pain ever since. No significant PMH on file.   Clinical Impression   Per son report; pt has required assist with ADLs and has primarily been functioning at bed level PTA. Currently pt is overall min assist +2 for functional mobility and mod assist for LB ADLs. Educated on posterior hip precautions. Recommending SNF for further rehab prior to return home to maximize independence and safety with ADLs and functional mobility; son is agreeable. Pt would benefit from continued skilled OT to address established goals.    Follow Up Recommendations  SNF;Supervision - Intermittent    Equipment Recommendations  Other (comment) (TBD at next venue)    Recommendations for Other Services       Precautions / Restrictions Precautions Precautions: Fall;Posterior Hip Precaution Booklet Issued: Yes (comment) Precaution Comments: Reviewed hip precautions and son translated handout Required Braces or Orthoses: Knee Immobilizer - Left Knee Immobilizer - Left: Other (comment) (on pt upon arrival, no specific order) Restrictions Weight Bearing Restrictions: Yes LLE Weight Bearing: Weight bearing as tolerated      Mobility Bed Mobility Overal bed mobility: Needs Assistance;+2 for physical assistance Bed Mobility: Supine to Sit     Supine to sit: +2 for physical assistance;Mod assist;HOB elevated     General bed mobility comments: Cues for technique, use of bed pad for positioning, and support to trunk to boost up to sitting.    Transfers Overall transfer level: Needs assistance Equipment used: Rolling walker (2 wheeled) Transfers: Sit to/from  Stand Sit to Stand: Min assist;+2 physical assistance;From elevated surface         General transfer comment: Assist to boost up to standing after review of hip flexion precaution.  Assist to steady once standing w/ cues to stand upright.    Balance Overall balance assessment: Needs assistance Sitting-balance support: Bilateral upper extremity supported;Feet supported Sitting balance-Leahy Scale: Fair     Standing balance support: Bilateral upper extremity supported;During functional activity Standing balance-Leahy Scale: Poor Standing balance comment: RW for support                            ADL Overall ADL's : Needs assistance/impaired Eating/Feeding: Set up;Sitting   Grooming: Set up;Sitting   Upper Body Bathing: Supervision/ safety;Set up;Sitting   Lower Body Bathing: Moderate assistance;Sit to/from stand   Upper Body Dressing : Supervision/safety;Set up;Sitting   Lower Body Dressing: Moderate assistance;Sit to/from stand   Toilet Transfer: Minimal assistance;+2 for safety/equipment;Ambulation;BSC;RW (BSC over toilet) Toilet Transfer Details (indicate cue type and reason): Simulated by transfer from EOB with ambulation to chair.         Functional mobility during ADLs: Minimal assistance;+2 for safety/equipment;Rolling walker General ADL Comments: Son present for OT session and used as interpreter. Educated pt and son on posterior hip precautions.     Vision     Perception     Praxis      Pertinent Vitals/Pain Pain Assessment: Faces Faces Pain Scale: Hurts even more Pain Location: back (chronic), L hip and down LLE, R hip  Pain Descriptors / Indicators: Grimacing;Guarding;Moaning Pain Intervention(s): Limited activity within patient's tolerance;Monitored  during session;Repositioned     Hand Dominance     Extremity/Trunk Assessment Upper Extremity Assessment Upper Extremity Assessment: Generalized weakness      Cervical / Trunk  Assessment Cervical / Trunk Assessment: Kyphotic   Communication Communication Communication: Prefers language other than English ([place]-son translated)   Cognition Arousal/Alertness: Awake/alert Behavior During Therapy: WFL for tasks assessed/performed Overall Cognitive Status: Within Functional Limits for tasks assessed                     General Comments       Exercises   Other Exercises Other Exercises: Encouraged pt to sit up in chair for at least 2 hours and to always have staff w/ her when OOB or chair   Shoulder Instructions      Home Living Family/patient expects to be discharged to:: Skilled nursing facility                                        Prior Functioning/Environment Level of Independence: Needs assistance  Gait / Transfers Assistance Needed: Son reports pt has only ambulated ~1 mile total since September. She stayed in the hospital in [place] for 40 days after her surgery and then was d/c home where she laid in bed almost all the time.   ADL's / Homemaking Assistance Needed: Needs assist w/ bathing/dressing from sister. Communication / Swallowing Assistance Needed: Does not speak English, son translated      OT Diagnosis: Generalized weakness;Acute pain   OT Problem List: Decreased strength;Decreased range of motion;Decreased activity tolerance;Impaired balance (sitting and/or standing);Decreased safety awareness;Decreased knowledge of use of DME or AE;Decreased knowledge of precautions;Pain   OT Treatment/Interventions: Self-care/ADL training;Energy conservation;DME and/or AE instruction;Therapeutic activities;Patient/family education;Balance training    OT Goals(Current goals can be found in the care plan section) Acute Rehab OT Goals Patient Stated Goal: per son, to go to rehab OT Goal Formulation: With patient/family Time For Goal Achievement: 05/13/15 Potential to Achieve Goals: Good ADL Goals Pt Will Perform Grooming:  with min guard assist;standing Pt Will Perform Lower Body Bathing: with min guard assist;with adaptive equipment;sit to/from stand Pt Will Perform Lower Body Dressing: with min guard assist;with adaptive equipment;sit to/from stand Pt Will Transfer to Toilet: with min guard assist;ambulating;bedside commode (over toilet) Pt Will Perform Toileting - Clothing Manipulation and hygiene: with min guard assist;sit to/from stand Additional ADL Goal #1: Pt will maintain posterior hip precautions throughout ADL activity.  OT Frequency: Min 2X/week   Barriers to D/C: Inaccessible home environment;Decreased caregiver support  Son lives in a second floor apartment and works during the day.       Co-evaluation PT/OT/SLP Co-Evaluation/Treatment: Yes Reason for Co-Treatment: For patient/therapist safety PT goals addressed during session: Mobility/safety with mobility;Balance;Proper use of DME;Strengthening/ROM OT goals addressed during session: ADL's and self-care      End of Session Equipment Utilized During Treatment: Gait belt;Rolling walker;Left knee immobilizer Nurse Communication: Mobility status  Activity Tolerance: Patient tolerated treatment well Patient left: in chair;with call bell/phone within reach;with chair alarm set   Time: 5409-8119 OT Time Calculation (min): 28 min Charges:  OT General Charges $OT Visit: 1 Procedure OT Evaluation $OT Eval Moderate Complexity: 1 Procedure G-Codes:     Gaye Alken M.S., OTR/L Pager: [phone]  04/29/2015, 10:18 AM

## 2015-04-29 NOTE — Evaluation (Signed)
Physical Therapy Evaluation Patient Details Name: Amber Fisher MRN: [id number] DOB: [birth year] Today's Date: 04/29/2015   History of Present Illness  Pt is a 82 y/o F who was living in [place] for the past 8 months, fell and broke her Lt hip in September 2016.  Pt sought care in [place] and screws were placed but pt has been having severe hip pain ever since. No significant PMH on file.  Clinical Impression  Pt is s/p Lt THA resulting in the deficits listed below (see PT Problem List). Amber Fisher presents w/ pain and decreased endurance, strength, ROM.  She currently requires min +1-2 assist for safe mobility and will benefit from SNF at d/c. Pt will benefit from skilled PT to increase their independence and safety with mobility to allow discharge to the venue listed below.     Follow Up Recommendations SNF;Supervision for mobility/OOB    Equipment Recommendations  Other (comment) (TBD at next venue of care)    Recommendations for Other Services       Precautions / Restrictions Precautions Precautions: Fall;Posterior Hip Precaution Booklet Issued: Yes (comment) Precaution Comments: Reviewed hip precautions and son translated handout Required Braces or Orthoses: Knee Immobilizer - Left Knee Immobilizer - Left: Other (comment) (on pt upon arrival, no specific order) Restrictions Weight Bearing Restrictions: Yes LLE Weight Bearing: Weight bearing as tolerated      Mobility  Bed Mobility Overal bed mobility: Needs Assistance;+2 for physical assistance Bed Mobility: Supine to Sit     Supine to sit: +2 for physical assistance;Mod assist;HOB elevated     General bed mobility comments: Cues for technique, use of bed pad for positioning, and support to trunk to boost up to sitting.    Transfers Overall transfer level: Needs assistance Equipment used: Rolling walker (2 wheeled) Transfers: Sit to/from Stand Sit to Stand: Min assist;+2 physical assistance;From elevated surface          General transfer comment: Assist to boost up to standing after review of hip flexion precaution.  Assist to steady once standing w/ cues to stand upright.  Ambulation/Gait Ambulation/Gait assistance: Min assist;+2 safety/equipment Ambulation Distance (Feet): 40 Feet Assistive device: Rolling walker (2 wheeled) Gait Pattern/deviations: Step-to pattern;Decreased stride length;Decreased stance time - left;Decreased weight shift to left;Decreased step length - right;Antalgic;Trunk flexed   Gait velocity interpretation: <1.8 ft/sec, indicative of risk for recurrent falls General Gait Details: Cues for sequencing w/ RW and assist managing/directing RW.  Cues for upright posture.    Stairs            Wheelchair Mobility    Modified Rankin (Stroke Patients Only)       Balance Overall balance assessment: Needs assistance Sitting-balance support: Bilateral upper extremity supported;Feet supported Sitting balance-Leahy Scale: Fair     Standing balance support: Bilateral upper extremity supported;During functional activity Standing balance-Leahy Scale: Poor Standing balance comment: Relies on RW for support                             Pertinent Vitals/Pain Pain Assessment: Faces Faces Pain Scale: Hurts even more Pain Location: back (chronic), Lt hip down Lt LE, Rt hip (due to lack of mobility) (back pain resultant from running from dog and falling ) Pain Descriptors / Indicators: Grimacing;Guarding;Moaning Pain Intervention(s): Limited activity within patient's tolerance;Monitored during session;Repositioned    Home Living Family/patient expects to be discharged to:: Skilled nursing facility  Prior Function Level of Independence: Needs assistance   Gait / Transfers Assistance Needed: Son reports pt has only ambulated ~1 mile total since September. She stayed in the hospital in [place] for 40 days after her surgery and then was d/c  home where she laid in bed almost all the time.    ADL's / Homemaking Assistance Needed: Needs assist w/ bathing/dressing from sister.        Hand Dominance        Extremity/Trunk Assessment   Upper Extremity Assessment: Generalized weakness           Lower Extremity Assessment: Generalized weakness;LLE deficits/detail   LLE Deficits / Details: limited ROM and strength as expected s/p Lt THA  Cervical / Trunk Assessment: Kyphotic  Communication   Communication: Prefers language other than English  Cognition Arousal/Alertness: Awake/alert Behavior During Therapy: WFL for tasks assessed/performed Overall Cognitive Status: Within Functional Limits for tasks assessed                      General Comments      Exercises Other Exercises Other Exercises: Encouraged pt to sit up in chair for at least 2 hours and to always have staff w/ her when OOB or chair      Assessment/Plan    PT Assessment Patient needs continued PT services  PT Diagnosis Difficulty walking;Abnormality of gait;Acute pain;Generalized weakness   PT Problem List Decreased strength;Decreased range of motion;Decreased activity tolerance;Decreased balance;Decreased mobility;Decreased knowledge of use of DME;Decreased safety awareness;Decreased knowledge of precautions;Pain  PT Treatment Interventions DME instruction;Gait training;Functional mobility training;Therapeutic activities;Therapeutic exercise;Balance training;Neuromuscular re-education;Patient/family education;Modalities   PT Goals (Current goals can be found in the Care Plan section) Acute Rehab PT Goals Patient Stated Goal: per son, to go to rehab PT Goal Formulation: With patient/family Time For Goal Achievement: 05/11/15 Potential to Achieve Goals: Good    Frequency 7X/week   Barriers to discharge        Co-evaluation PT/OT/SLP Co-Evaluation/Treatment: Yes Reason for Co-Treatment: For patient/therapist safety PT goals  addressed during session: Mobility/safety with mobility;Balance;Proper use of DME;Strengthening/ROM         End of Session Equipment Utilized During Treatment: Gait belt;Left knee immobilizer Activity Tolerance: Patient limited by fatigue;Patient limited by pain Patient left: in chair;with call bell/phone within reach;with chair alarm set;with family/visitor present Nurse Communication: Mobility status         Time: 8295-6213 PT Time Calculation (min) (ACUTE ONLY): 31 min   Charges:   PT Evaluation $PT Eval Moderate Complexity: 1 Procedure     PT G Codes:       Michail Jewels PT, DPT [phone] Pager: [phone] 04/29/2015, 9:42 AM

## 2015-04-30 ENCOUNTER — Other Ambulatory Visit (HOSPITAL_COMMUNITY): Payer: Medicare Other

## 2015-04-30 ENCOUNTER — Encounter (HOSPITAL_COMMUNITY): Payer: Self-pay | Admitting: Orthopedic Surgery

## 2015-04-30 DIAGNOSIS — I82409 Acute embolism and thrombosis of unspecified deep veins of unspecified lower extremity: Secondary | ICD-10-CM

## 2015-04-30 DIAGNOSIS — I824Z2 Acute embolism and thrombosis of unspecified deep veins of left distal lower extremity: Secondary | ICD-10-CM

## 2015-04-30 LAB — TYPE AND SCREEN
ABO/RH(D): O POS
ANTIBODY SCREEN: NEGATIVE
UNIT DIVISION: 0
Unit division: 0

## 2015-04-30 LAB — CBC
HCT: 29.9 % — ABNORMAL LOW (ref 36.0–46.0)
HEMOGLOBIN: 10 g/dL — AB (ref 12.0–15.0)
MCH: 29.7 pg (ref 26.0–34.0)
MCHC: 33.4 g/dL (ref 30.0–36.0)
MCV: 88.7 fL (ref 78.0–100.0)
Platelets: 268 10*3/uL (ref 150–400)
RBC: 3.37 MIL/uL — AB (ref 3.87–5.11)
RDW: 16 % — ABNORMAL HIGH (ref 11.5–15.5)
WBC: 13.6 10*3/uL — AB (ref 4.0–10.5)

## 2015-04-30 MED ORDER — GLYCERIN (LAXATIVE) 2.1 G RE SUPP
1.0000 | Freq: Every day | RECTAL | Status: DC | PRN
  Administered 2015-04-30 – 2015-05-01 (×2): 1 via RECTAL
  Filled 2015-04-30 (×3): qty 1

## 2015-04-30 MED ORDER — POLYETHYLENE GLYCOL 3350 17 G PO PACK
17.0000 g | PACK | Freq: Two times a day (BID) | ORAL | Status: DC
  Administered 2015-04-30 – 2015-05-01 (×2): 17 g via ORAL
  Filled 2015-04-30 (×2): qty 1

## 2015-04-30 MED ORDER — SENNOSIDES-DOCUSATE SODIUM 8.6-50 MG PO TABS
2.0000 | ORAL_TABLET | Freq: Once | ORAL | Status: AC
  Administered 2015-04-30: 2 via ORAL
  Filled 2015-04-30: qty 2

## 2015-04-30 NOTE — Progress Notes (Signed)
Physical Therapy Treatment Patient Details Name: Amber Fisher MRN: [id number] DOB: 1933/08/22 Today's Date: 04/30/2015    History of Present Illness Pt is a 82 y/o F who was living in [place] for the past 8 months, fell and broke her Lt hip in September 2016.  Pt sought care in [place] and screws were placed but pt has been having severe hip pain ever since. No significant PMH on file.    PT Comments    Patient progressing slowly toward mobility goals. Son present and interpreted. Continue to progress as tolerated with anticipated d/c to SNF for further skilled PT services.    Follow Up Recommendations  SNF;Supervision for mobility/OOB     Equipment Recommendations  Other (comment) (TBD at next venue of care)    Recommendations for Other Services       Precautions / Restrictions Precautions Precautions: Fall;Posterior Hip Precaution Booklet Issued: Yes (comment) Precaution Comments: Reviewed hip precautions and son translated handout Required Braces or Orthoses: Knee Immobilizer - Left Knee Immobilizer - Left: Other (comment) (on pt upon arrival, no specific order) Restrictions Weight Bearing Restrictions: Yes LLE Weight Bearing: Weight bearing as tolerated    Mobility  Bed Mobility Overal bed mobility: Needs Assistance Bed Mobility: Sit to Supine       Sit to supine: Mod assist   General bed mobility comments: assist to bring bilat LE inot bed and vc for sequencing; pt able to scoot up in bed with min A and cues for techniqe  Transfers Overall transfer level: Needs assistance Equipment used: Rolling walker (2 wheeled) Transfers: Sit to/from Stand Sit to Stand: Min assist;+2 physical assistance         General transfer comment: vc for hand placement and technique; assist to power up into standing and to gain balance and WS  upon standing  Ambulation/Gait Ambulation/Gait assistance: Min assist;Min guard Ambulation Distance (Feet): 100 Feet Assistive device:  Rolling walker (2 wheeled) Gait Pattern/deviations: Step-to pattern;Antalgic;Trunk flexed     General Gait Details: cues for sequencing, posture, and position of RW; pt with tendency to lean on RW and place hands on front bar requiring tactile cues for safe hand placement; min A for WS and balance at times   Stairs            Wheelchair Mobility    Modified Rankin (Stroke Patients Only)       Balance Overall balance assessment: Needs assistance Sitting-balance support: Bilateral upper extremity supported;Feet supported Sitting balance-Leahy Scale: Fair     Standing balance support: Bilateral upper extremity supported Standing balance-Leahy Scale: Poor                      Cognition Arousal/Alertness: Awake/alert Behavior During Therapy: WFL for tasks assessed/performed Overall Cognitive Status: Within Functional Limits for tasks assessed                      Exercises Total Joint Exercises Ankle Circles/Pumps: AROM;Both;10 reps;Seated Quad Sets: AROM;Both;10 reps;Seated Hip ABduction/ADduction: AAROM;Left;10 reps;Seated    General Comments General comments (skin integrity, edema, etc.): son and daughter in law present for session; son interpreted       Pertinent Vitals/Pain Pain Assessment: Faces Faces Pain Scale: Hurts even more Pain Location: L hip Pain Descriptors / Indicators: Grimacing;Guarding;Moaning Pain Intervention(s): Limited activity within patient's tolerance;Monitored during session;Premedicated before session;Repositioned    Home Living  Prior Function            PT Goals (current goals can now be found in the care plan section) Acute Rehab PT Goals Patient Stated Goal: per son, to go to rehab PT Goal Formulation: With patient/family Time For Goal Achievement: 05/11/15 Potential to Achieve Goals: Good Progress towards PT goals: Progressing toward goals    Frequency  7X/week    PT Plan  Current plan remains appropriate    Co-evaluation PT/OT/SLP Co-Evaluation/Treatment: Yes           End of Session Equipment Utilized During Treatment: Gait belt;Left knee immobilizer Activity Tolerance: Patient tolerated treatment well Patient left: in bed;with call bell/phone within reach;with nursing/sitter in room;with family/visitor present     Time: 1057-1140 PT Time Calculation (min) (ACUTE ONLY): 43 min  Charges:  $Gait Training: 8-22 mins $Therapeutic Exercise: 8-22 mins $Therapeutic Activity: 8-22 mins                    G Codes:      Derek Mound, PTA Pager: [phone]   04/30/2015, 2:50 PM

## 2015-04-30 NOTE — Progress Notes (Signed)
PROGRESS NOTE  Eydie Wormley ZOX:[id number] DOB: 04-Mar-1934 DOA: 04/27/2015 PCP: Lonia Blood, MD  HPI: In brief this is a 82 year old female from [place], who fell about 4 months ago and broke her hip, and she underwent repair he [place] with 3 screws, with persistent associated angulation deformity, patient in severe pain over the last 4 months and unable to ambulate.  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 Luiz Blare - doing well post op, PT pending but likely needs SNF - on Eliquis  Heart murmur - 2D echo pending - asymptomatic  Leukocytosis - Likely reactive, resolved  Cough - mild, CXR, IS  ABLA - Hb down to 8.1, transfused 2U pRBC on 2/12, hemoglobin improved to 10 today   Diet: Diet regular Room service appropriate?: Yes; Fluid consistency:: Thin Fluids: none DVT Prophylaxis: ASA  Code Status: Full Code Family Communication: d/w daughter-in-law bedside  Disposition Plan: home vs SNF 1 day Barriers to discharge: monitoring CBC  Consultants:  Orthopedic surgery   Procedures:  hemiarthroplasty with hardware removal on left 2/11 by Dr. Luiz Blare   Antibiotics Ancef - periop   Studies  Pelvis Portable  04/28/2015  CLINICAL DATA:  Status post left hip hemiarthroplasty EXAM: PORTABLE PELVIS 1-2 VIEWS COMPARISON:  Plain film of the pelvis and left hip dated 04/27/2015. FINDINGS: Interval revision arthroplasty at the left hip. Hardware appears intact and well positioned. No surgical complicating feature seen. Expected postsurgical changes  within the adjacent soft tissues. IMPRESSION: Revision arthroplasty at the left hip. No surgical complicating feature seen. Electronically Signed   By: Bary Richard M.D.   On: 04/28/2015 16:56   Dg Chest Port 1 View  04/29/2015  CLINICAL DATA:  Throat discomfort, cough EXAM: PORTABLE CHEST 1 VIEW COMPARISON:  None. FINDINGS: Scarring in the bilateral upper lobes, left greater than right. Chronic interstitial markings/emphysematous changes. No focal consolidation. No pleural effusion or pneumothorax. The heart is normal in size. Mild degenerative changes of the right shoulder. IMPRESSION: No evidence of acute cardiopulmonary disease. Scarring in the bilateral upper lobes, left greater than right. Electronically Signed   By: Charline Bills M.D.   On: 04/29/2015 12:05   Dg C-arm 1-60 Min-no Report  04/28/2015  CLINICAL DATA: left hip fracture C-ARM 1-60 MINUTES Fluoroscopy was utilized by the requesting physician.  No radiographic interpretation.    Objective  Filed Vitals:   04/30/15 0000 04/30/15 0100 04/30/15 0300 04/30/15 0557  BP:    121/48  Pulse:    104  Temp:    98.4 F (36.9 C)  TempSrc:    Oral  Resp:    18  Weight:      SpO2: 94% 99% 94% 95%    Intake/Output Summary (Last 24 hours) at 04/30/15 1419 Last data filed at 04/30/15 0100  Gross per 24 hour  Intake    575 ml  Output    500 ml  Net     75 ml   Filed Weights   04/28/15 0235  Weight: 60.791 kg (134 lb 0.3 oz)  Exam:  GENERAL: NAD  HEENT: no scleral icterus, PERRL  NECK: supple, no LAD  LUNGS: CTA biL, no wheezing  HEART: RRR, 3/6 SEM  ABDOMEN: soft, non tender  EXTREMITIES: no clubbing / cyanosis  NEUROLOGIC: non focal   Data Reviewed: Basic Metabolic Panel:  Recent Labs Lab 04/27/15 2133 04/28/15 1547 04/29/15 0643  NA 139 140 137  K 3.8 4.7 4.3  CL 103  --  101  CO2 21*  --  21*  GLUCOSE 85 115* 144*  BUN 14  --  14  CREATININE 0.68  --  0.78  CALCIUM 9.4  --  8.6*   Liver  Function Tests:  Recent Labs Lab 04/27/15 2133  AST 23  ALT 15  ALKPHOS 83  BILITOT 0.5  PROT 7.0  ALBUMIN 3.0*   CBC:  Recent Labs Lab 04/27/15 2133 04/28/15 1547 04/29/15 0643 04/30/15 0437  WBC 11.4*  --  9.9 13.6*  NEUTROABS 5.4  --   --   --   HGB 11.3* 9.5* 8.1* 10.0*  HCT 33.4* 28.0* 24.9* 29.9*  MCV 89.5  --  88.9 88.7  PLT 332  --  370 268    Recent Results (from the past 240 hour(s))  Surgical pcr screen     Status: Abnormal   Collection Time: 04/28/15  4:51 AM  Result Value Ref Range Status   MRSA, PCR NEGATIVE NEGATIVE Final   Staphylococcus aureus POSITIVE (A) NEGATIVE Final    Comment:        The Xpert SA Assay (FDA approved for NASAL specimens in patients over 19 years of age), is one component of a comprehensive surveillance program.  Test performance has been validated by [facility] for patients greater than or equal to 54 year old. It is not intended to diagnose infection nor to guide or monitor treatment.      Scheduled Meds: . apixaban  10 mg Oral BID   Followed by  . [START ON 05/06/2015] apixaban  5 mg Oral BID  . docusate sodium  100 mg Oral BID  . ferrous sulfate  325 mg Oral Q breakfast   Continuous Infusions:   Pamella Pert, MD [facility] Pager [phone]. If 7 PM - 7 AM, please contact night-coverage at [email or web address], password [facility] 04/30/2015, 2:19 PM  LOS: 2 days

## 2015-04-30 NOTE — Clinical Social Work Note (Signed)
Clinical Social Work Assessment  Patient Details  Name: Amber Fisher MRN: [id number] Date of Birth: [birth year]  Date of referral:  04/30/15               Reason for consult:  Facility Placement                Permission sought to share information with:    Permission granted to share information::  Yes, Verbal Permission Granted  Name::      Amber Fisher and [name])  Agency::   ([facility])  Relationship::     Contact Information:     Housing/Transportation Living arrangements for the past 2 months:  Single Family Home Source of Information:  Other (Comment Required), Adult Children (son and Amber Fisher) Patient Interpreter Needed:   (family/patient declined need for interpreter) Criminal Activity/Legal Involvement Pertinent to Current Situation/Hospitalization:  No - Comment as needed Significant Relationships:  Adult Children, Other Family Members Lives with:  Adult Children Do you feel safe going back to the place where you live?  No Need for family participation in patient care:  Yes (Comment)  Care giving concerns:  Amber Fisher- family is requesting SNF at time of discharge.   Social Worker assessment / plan:  CSW met with patient and family at bedside.  Patient speaks very limited English though she declined an interpreter at this time.  Niece, Amber Fisher, at bedside provided interpretation. 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.  [facility] has agreed for patient to be admitted tomorrow pending MD order.  Patient will be transported via [facility].  Amber Fisher can be reached at [phone] and [name] can be reached at [phone]  Employment status:  Retired Forensic scientist:  Medicare PT Recommendations:  [place] / Referral to community resources:  [place]  Patient/Family's Response to care:  Patient and family are all agreeable to SNF.  Patient/Family's Understanding of and Emotional Response to Diagnosis, Current  Treatment, and Prognosis:  Patient and family are realistic regarding needed therapies at time of discharge and understands the procedure healing time and processes.  Emotional Assessment Appearance:  Appears stated age Attitude/Demeanor/Rapport:   (pleasant- appropriate) Affect (typically observed):  Accepting, Adaptable Orientation:  Oriented to Self, Oriented to Place, Oriented to  Time, Oriented to Situation Alcohol / Substance use:  Not Applicable Psych involvement (Current and /or in the community):  No (Comment)  Discharge Needs  Concerns to be addressed:  No discharge needs identified Readmission within the last 30 days:  No Current discharge risk:  None Barriers to Discharge:  No Barriers Identified   Dulcy Fanny, LCSW 04/30/2015, 2:44 PM

## 2015-04-30 NOTE — NC FL2 (Signed)
[place] MEDICAID FL2 LEVEL OF CARE SCREENING TOOL     IDENTIFICATION  Patient Name: Amber Fisher Birthdate: [birth year] Sex: female Admission Date (Current Location): 04/27/2015  [facility] and [place] Number:  Producer, television/film/video and Address:  The [facility], [address], [place], [place] [address]      Provider Number: [id number]  Attending Physician Name and Address:  Leatha Gilding, MD  Relative Name and Phone Number:       Current Level of Care: Hospital Recommended Level of Care: Skilled Nursing Facility Prior Approval Number:    Date Approved/Denied:   PASRR Number: [id number]  Discharge Plan: SNF    Current Diagnoses: Patient Active Problem List   Diagnosis Date Noted  . Acute blood loss anemia 04/29/2015  . 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 Pressure ulcer 04/28/2015  . Painful orthopaedic hardware 04/28/2015    Orientation RESPIRATION BLADDER Height & Weight     Self, Time, Situation, Place  Normal Continent Weight: 134 lb 0.3 oz (60.791 kg) Height:     BEHAVIORAL SYMPTOMS/MOOD NEUROLOGICAL BOWEL NUTRITION STATUS      Continent Diet (please see dc summary for dietrary needs at time of discharge)  AMBULATORY STATUS COMMUNICATION OF NEEDS Skin   Limited Assist Verbally Surgical wounds                       Personal Care Assistance Level of Assistance  Bathing, Dressing Bathing Assistance: Limited assistance   Dressing Assistance: Limited assistance     Functional Limitations Info             SPECIAL CARE FACTORS FREQUENCY  PT (By licensed PT), OT (By licensed OT)                    Contractures Contractures Info: Not present    Additional Factors Info                  Current Medications (04/30/2015):  This is the current hospital active medication list Current Facility-Administered Medications  Medication Dose Route Frequency Provider Last Rate Last Dose  .  acetaminophen (TYLENOL) tablet 650 mg  650 mg Oral Q6H PRN Marshia Ly, PA-C   650 mg at 04/29/15 2225   Or  . acetaminophen (TYLENOL) suppository 650 mg  650 mg Rectal Q6H PRN Marshia Ly, PA-C      . alum & mag hydroxide-simeth (MAALOX/MYLANTA) 200-200-20 MG/5ML suspension 30 mL  30 mL Oral Q4H PRN Marshia Ly, PA-C   30 mL at 04/29/15 1108  . apixaban (ELIQUIS) tablet 10 mg  10 mg Oral BID Bertram Millard, RPH   10 mg at 04/30/15 3086   Followed by  . [START ON 05/06/2015] apixaban (ELIQUIS) tablet 5 mg  5 mg Oral BID Bertram Millard, RPH      . bisacodyl (DULCOLAX) EC tablet 5 mg  5 mg Oral Daily PRN Marshia Ly, PA-C      . diphenhydrAMINE (BENADRYL) capsule 25 mg  25 mg Oral Q8H PRN Rolan Lipa, NP   25 mg at 04/29/15 2225  . docusate sodium (COLACE) capsule 100 mg  100 mg Oral BID Marshia Ly, PA-C   100 mg at 04/30/15 5784  . ferrous sulfate tablet 325 mg  325 mg Oral Q breakfast Marshia Ly, PA-C   325 mg at 04/30/15 6962  . HYDROcodone-acetaminophen (NORCO/VICODIN) 5-325 MG per tablet 1-2 tablet  1-2 tablet  Oral Q6H PRN Hillary Bow, DO   1 tablet at 04/30/15 [phone]  . methocarbamol (ROBAXIN) tablet 500 mg  500 mg Oral Q6H PRN Marshia Ly, PA-C   500 mg at 04/30/15 0132   Or  . methocarbamol (ROBAXIN) 500 mg in dextrose 5 % 50 mL IVPB  500 mg Intravenous Q6H PRN Marshia Ly, PA-C      . morphine 2 MG/ML injection 1-2 mg  1-2 mg Intravenous Q3H PRN Marshia Ly, PA-C   2 mg at 04/29/15 1630  . ondansetron (ZOFRAN) tablet 4 mg  4 mg Oral Q6H PRN Marshia Ly, PA-C       Or  . ondansetron [facility]) injection 4 mg  4 mg Intravenous Q6H PRN Marshia Ly, PA-C      . polyethylene glycol (MIRALAX / GLYCOLAX) packet 17 g  17 g Oral Daily PRN Marshia Ly, PA-C      . zolpidem (AMBIEN) tablet 5 mg  5 mg Oral QHS PRN Marshia Ly, PA-C         Discharge Medications: Please see discharge summary for a list of discharge medications.  Relevant Imaging  Results:  Relevant Lab Results:   Additional Information SSN: [id number]  Rondel Baton, LCSW

## 2015-04-30 NOTE — Clinical Social Work Placement (Signed)
   CLINICAL SOCIAL WORK PLACEMENT  NOTE  Date:  04/30/2015  Patient Details  Name: Amber Fisher MRN: [id number] Date of Birth: 1933/09/15  Clinical Social Work is seeking post-discharge placement for this patient at the Skilled  Nursing Facility level of care (*CSW will initial, date and re-position this form in  chart as items are completed):  Yes   Patient/family provided with [facility] Clinical Social Work Department's list of facilities offering this level of care within the geographic area requested by the patient (or if unable, by the patient's family).  Yes   Patient/family informed of their freedom to choose among providers that offer the needed level of care, that participate in Medicare, Medicaid or managed care program needed by the patient, have an available bed and are willing to accept the patient.  Yes   Patient/family informed of [facility]'s ownership interest in [facility] and [facility], as well as of the fact that they are under no obligation to receive care at these facilities.  PASRR submitted to EDS on 04/30/15     PASRR number received on 04/30/15     Existing PASRR number confirmed on       FL2 transmitted to all facilities in geographic area requested by pt/family on 04/30/15     FL2 transmitted to all facilities within larger geographic area on       Patient informed that his/her managed care company has contracts with or will negotiate with certain facilities, including the following:        Yes   Patient/family informed of bed offers received.  Patient chooses bed at [facility]     Physician recommends and patient chooses bed at      Patient to be transferred to [facility] on 05/01/15.  Patient to be transferred to facility by [facility]     Patient family notified on 05/01/15 of transfer.  Name of family member notified:  Lindalou Hose     PHYSICIAN Please sign FL2, Please prepare priority discharge summary, including medications,  Please prepare prescriptions     Additional Comment:    _______________________________________________ Rondel Baton, LCSW 04/30/2015, 2:47 PM

## 2015-04-30 NOTE — Progress Notes (Signed)
Subjective: 2 Days Post-Op Procedure(s) (LRB): ARTHROPLASTY BIPOLAR HIP (HEMIARTHROPLASTY  WITH HARDWARE REMOVAL,  (Left) Patient reports pain as mild.  Daughter-in-law who is at bedside reports that she believes that her mother-in-law needs a skilled nursing facility.  Objective: Vital signs in last 24 hours: Temp:  [98.4 F (36.9 C)-100 F (37.8 C)] 98.4 F (36.9 C) (02/13 0557) Pulse Rate:  [93-117] 104 (02/13 0557) Resp:  [14-18] 18 (02/13 0557) BP: (101-139)/(40-56) 121/48 mmHg (02/13 0557) SpO2:  [94 %-99 %] 95 % (02/13 0557)  Intake/Output from previous day: 02/12 0701 - 02/13 0700 In: 1055 [P.O.:720; Blood:335] Out: 500 [Urine:500] Intake/Output this shift:     Recent Labs  04/27/15 2133 04/28/15 1547 04/29/15 0643 04/30/15 0437  HGB 11.3* 9.5* 8.1* 10.0*    Recent Labs  04/29/15 0643 04/30/15 0437  WBC 9.9 13.6*  RBC 2.80* 3.37*  HCT 24.9* 29.9*  PLT 370 268    Recent Labs  04/27/15 2133 04/28/15 1547 04/29/15 0643  NA 139 140 137  K 3.8 4.7 4.3  CL 103  --  101  CO2 21*  --  21*  BUN 14  --  14  CREATININE 0.68  --  0.78  GLUCOSE 85 115* 144*  CALCIUM 9.4  --  8.6*   No results for input(s): LABPT, INR in the last 72 hours. Left hip exam: Neurologically intact Neurovascular intact Sensation intact distally Intact pulses distally Dorsiflexion/Plantar flexion intact Incision: dressing C/D/I Compartment soft  Assessment/Plan: 2 Days Post-Op Procedure(s) (LRB): ARTHROPLASTY BIPOLAR HIP (HEMIARTHROPLASTY  WITH HARDWARE REMOVAL,  (Left) Acute blood loss anemia, improved after transfusion. Plan: Weight-bear as tolerated on left with posterior hip precautions. Aspirin 325 mg twice daily 1 month for DVT prophylaxis. Patient needs either home health PT or short-term skilled nursing facility. We will leave this determination up to her progress in physical therapy. Up with therapy Plan for discharge tomorrow  Lowell Makara G 04/30/2015,  8:22 AM

## 2015-04-30 NOTE — Discharge Instructions (Signed)
INSTRUCTIONS AFTER JOINT REPLACEMENT  ° °o Remove items at home which could result in a fall. This includes throw rugs or furniture in walking pathways °o ICE to the affected joint every three hours while awake for 30 minutes at a time, for at least the first 3-5 days, and then as needed for pain and swelling.  Continue to use ice for pain and swelling. You may notice swelling that will progress down to the foot and ankle.  This is normal after surgery.  Elevate your leg when you are not up walking on it.   °o Continue to use the breathing machine you got in the hospital (incentive spirometer) which will help keep your temperature down.  It is common for your temperature to cycle up and down following surgery, especially at night when you are not up moving around and exerting yourself.  The breathing machine keeps your lungs expanded and your temperature down. ° ° °DIET:  As you were doing prior to hospitalization, we recommend a well-balanced diet. ° °DRESSING / WOUND CARE / SHOWERING ° °Keep the surgical dressing until follow up.  The dressing is water proof, so you can shower without any extra covering.  IF THE DRESSING FALLS OFF or the wound gets wet inside, change the dressing with sterile gauze.  Please use good hand washing techniques before changing the dressing.  Do not use any lotions or creams on the incision until instructed by your surgeon.   ° °ACTIVITY ° °o Increase activity slowly as tolerated, but follow the weight bearing instructions below.   °o No driving for 6 weeks or until further direction given by your physician.  You cannot drive while taking narcotics.  °o No lifting or carrying greater than 10 lbs. until further directed by your surgeon. °o Avoid periods of inactivity such as sitting longer than an hour when not asleep. This helps prevent blood clots.  °o You may return to work once you are authorized by your doctor.  ° ° ° °WEIGHT BEARING  ° °Weight bearing as tolerated with assist  device (walker, cane, etc) as directed, use it as long as suggested by your surgeon or therapist, typically at least 4-6 weeks. ° ° °EXERCISES ° °Results after joint replacement surgery are often greatly improved when you follow the exercise, range of motion and muscle strengthening exercises prescribed by your doctor. Safety measures are also important to protect the joint from further injury. Any time any of these exercises cause you to have increased pain or swelling, decrease what you are doing until you are comfortable again and then slowly increase them. If you have problems or questions, call your caregiver or physical therapist for advice.  ° °Rehabilitation is important following a joint replacement. After just a few days of immobilization, the muscles of the leg can become weakened and shrink (atrophy).  These exercises are designed to build up the tone and strength of the thigh and leg muscles and to improve motion. Often times heat used for twenty to thirty minutes before working out will loosen up your tissues and help with improving the range of motion but do not use heat for the first two weeks following surgery (sometimes heat can increase post-operative swelling).  ° °These exercises can be done on a training (exercise) mat, on the floor, on a table or on a bed. Use whatever works the best and is most comfortable for you.    Use music or television while you are exercising so that   the exercises are a pleasant break in your day. This will make your life better with the exercises acting as a break in your routine that you can look forward to.   Perform all exercises about fifteen times, three times per day or as directed.  You should exercise both the operative leg and the other leg as well. ° °Exercises include: °  °• Quad Sets - Tighten up the muscle on the front of the thigh (Quad) and hold for 5-10 seconds.   °• Straight Leg Raises - With your knee straight (if you were given a brace, keep it on),  lift the leg to 60 degrees, hold for 3 seconds, and slowly lower the leg.  Perform this exercise against resistance later as your leg gets stronger.  °• Leg Slides: Lying on your back, slowly slide your foot toward your buttocks, bending your knee up off the floor (only go as far as is comfortable). Then slowly slide your foot back down until your leg is flat on the floor again.  °• Angel Wings: Lying on your back spread your legs to the side as far apart as you can without causing discomfort.  °• Hamstring Strength:  Lying on your back, push your heel against the floor with your leg straight by tightening up the muscles of your buttocks.  Repeat, but this time bend your knee to a comfortable angle, and push your heel against the floor.  You may put a pillow under the heel to make it more comfortable if necessary.  ° °A rehabilitation program following joint replacement surgery can speed recovery and prevent re-injury in the future due to weakened muscles. Contact your doctor or a physical therapist for more information on knee rehabilitation.  ° ° °CONSTIPATION ° °Constipation is defined medically as fewer than three stools per week and severe constipation as less than one stool per week.  Even if you have a regular bowel pattern at home, your normal regimen is likely to be disrupted due to multiple reasons following surgery.  Combination of anesthesia, postoperative narcotics, change in appetite and fluid intake all can affect your bowels.  ° °YOU MUST use at least one of the following options; they are listed in order of increasing strength to get the job done.  They are all available over the counter, and you may need to use some, POSSIBLY even all of these options:   ° °Drink plenty of fluids (prune juice may be helpful) and high fiber foods °Colace 100 mg by mouth twice a day  °Senokot for constipation as directed and as needed Dulcolax (bisacodyl), take with full glass of water  °Miralax (polyethylene glycol)  once or twice a day as needed. ° °If you have tried all these things and are unable to have a bowel movement in the first 3-4 days after surgery call either your surgeon or your primary doctor.   ° °If you experience loose stools or diarrhea, hold the medications until you stool forms back up.  If your symptoms do not get better within 1 week or if they get worse, check with your doctor.  If you experience "the worst abdominal pain ever" or develop nausea or vomiting, please contact the office immediately for further recommendations for treatment. ° ° °ITCHING:  If you experience itching with your medications, try taking only a single pain pill, or even half a pain pill at a time.  You can also use Benadryl over the counter for itching or also to   help with sleep.   TED HOSE STOCKINGS:  Use stockings on both legs until for at least 2 weeks or as directed by physician office. They may be removed at night for sleeping.  MEDICATIONS:  See your medication summary on the After Visit Summary that nursing will review with you.  You may have some home medications which will be placed on hold until you complete the course of blood thinner medication.  It is important for you to complete the blood thinner medication as prescribed.  PRECAUTIONS:  If you experience chest pain or shortness of breath - call 911 immediately for transfer to the hospital emergency department.   If you develop a fever greater that 101 F, purulent drainage from wound, increased redness or drainage from wound, foul odor from the wound/dressing, or calf pain - CONTACT YOUR SURGEON.                                                   FOLLOW-UP APPOINTMENTS:  If you do not already have a post-op appointment, please call the office for an appointment to be seen by your surgeon.  Guidelines for how soon to be seen are listed in your After Visit Summary, but are typically between 1-4 weeks after surgery.  OTHER INSTRUCTIONS:   Knee  Replacement:  Do not place pillow under knee, focus on keeping the knee straight while resting. CPM instructions: 0-90 degrees, 2 hours in the morning, 2 hours in the afternoon, and 2 hours in the evening. Place foam block, curve side up under heel at all times except when in CPM or when walking.  DO NOT modify, tear, cut, or change the foam block in any way.  MAKE SURE YOU:   Understand these instructions.   Get help right away if you are not doing well or get worse.    Thank you for letting us be a part of your medical care team.  It is a privilege we respect greatly.  We hope these instructions will help you stay on track for a fast and full recovery!    _____________________________________________  Information on my medicine - ELIQUIS (apixaban)  This medication education was reviewed with me or my healthcare representative as part of my discharge preparation.  The pharmacist that spoke with me during my hospital stay was:  [employer], Pharm.D.  Why was Eliquis prescribed for you? Eliquis was prescribed to treat blood clots that may have been found in the veins of your legs (deep vein thrombosis) or in your lungs (pulmonary embolism) and to reduce the risk of them occurring again.  What do You need to know about Eliquis ? The starting dose is 10 mg (two 5 mg tablets) taken TWICE daily for the FIRST SEVEN (7) DAYS, then on (enter date)  05/06/15  the dose is reduced to ONE 5 mg tablet taken TWICE daily.  Eliquis may be taken with or without food.   Try to take the dose about the same time in the morning and in the evening. If you have difficulty swallowing the tablet whole please discuss with your pharmacist how to take the medication safely.  Take Eliquis exactly as prescribed and DO NOT stop taking Eliquis without talking to the doctor who prescribed the medication.  Stopping may increase your risk of developing a new blood clot.  Refill  your prescription before you run  out.  After discharge, you should have regular check-up appointments with your healthcare provider that is prescribing your Eliquis.    What do you do if you miss a dose? If a dose of ELIQUIS is not taken at the scheduled time, take it as soon as possible on the same day and twice-daily administration should be resumed. The dose should not be doubled to make up for a missed dose.  Important Safety Information A possible side effect of Eliquis is bleeding. You should call your healthcare provider right away if you experience any of the following: ? Bleeding from an injury or your nose that does not stop. ? Unusual colored urine (red or dark brown) or unusual colored stools (red or black). ? Unusual bruising for unknown reasons. ? A serious fall or if you hit your head (even if there is no bleeding).  Some medicines may interact with Eliquis and might increase your risk of bleeding or clotting while on Eliquis. To help avoid this, consult your healthcare provider or pharmacist prior to using any new prescription or non-prescription medications, including herbals, vitamins, non-steroidal anti-inflammatory drugs (NSAIDs) and supplements.  This website has more information on Eliquis (apixaban): http://www.eliquis.com/eliquis/home

## 2015-04-30 NOTE — Clinical Social Work Note (Signed)
Patient is set up to discharge to [facility] at time of discharge via [facility].  Projected dc: Tuesday 2/14- [place] aware and agreeable.  Vickii Penna, LCSW [phone]  5N1-9; 2S 15-16 and Hospital Psychiatric Service Line Licensed Clinical Social Worker

## 2015-05-01 ENCOUNTER — Other Ambulatory Visit (HOSPITAL_COMMUNITY): Payer: Medicare Other

## 2015-05-01 LAB — CBC
HCT: 30.3 % — ABNORMAL LOW (ref 36.0–46.0)
HEMOGLOBIN: 10.3 g/dL — AB (ref 12.0–15.0)
MCH: 31 pg (ref 26.0–34.0)
MCHC: 34 g/dL (ref 30.0–36.0)
MCV: 91.3 fL (ref 78.0–100.0)
PLATELETS: 270 10*3/uL (ref 150–400)
RBC: 3.32 MIL/uL — ABNORMAL LOW (ref 3.87–5.11)
RDW: 16.3 % — ABNORMAL HIGH (ref 11.5–15.5)
WBC: 15 10*3/uL — ABNORMAL HIGH (ref 4.0–10.5)

## 2015-05-01 MED ORDER — FERROUS SULFATE 325 (65 FE) MG PO TABS: 325.0000 mg | ORAL_TABLET | Freq: Every day | ORAL | Status: DC

## 2015-05-01 MED ORDER — APIXABAN 5 MG PO TABS: 10.0000 mg | ORAL_TABLET | Freq: Two times a day (BID) | ORAL | Status: DC

## 2015-05-01 MED ORDER — BISACODYL 5 MG PO TBEC: 5.0000 mg | DELAYED_RELEASE_TABLET | Freq: Every day | ORAL | Status: DC | PRN

## 2015-05-01 NOTE — Progress Notes (Signed)
Pt ready for d/c to SNF per MD. Has had 2 bowel movements today after glycerin suppository last night and this morning-abdominal discomfort relieved. Report was called to Dian Queen at [facility], all questions answered. Waiting for transportation via [facility]. Belongings gathered and will be sent with pt.   [name], [name]

## 2015-05-01 NOTE — Care Management Important Message (Signed)
Important Message  Patient Details  Name: Amber Fisher MRN: [id number] Date of Birth: [birth year]   Medicare Important Message Given:  Yes    Jacy Howat P Marquel Pottenger 05/01/2015, 3:16 PM

## 2015-05-01 NOTE — Clinical Social Work Note (Signed)
Patient will discharge today per MD order. Patient will discharge to: [facility] SNF RN to call report prior to transportation to: [phone] Transportation: [facility]- to be called at 1pm to arrive after 1:30  CSW sent discharge summary to SNF for review.  RN and family aware of these discharge plans.  Paperwork by SNF has been completed at bedside by liaison.  Vickii Penna, LCSW [phone]  5N1-9, 2S 15-16 and Psychiatric Service Line  Licensed Clinical Social Worker

## 2015-05-01 NOTE — Clinical Documentation Improvement (Signed)
Internal Medicine  Can the diagnosis of pressure ulcer be further specified?   Document if pressure ulcer with stage is Present on Admission   Document Site with laterality - Elbow, Back (upper/lower), Sacral, Hip, Buttock, Ankle, Heel, Head, Other (Specify)  Pressure Ulcer Stage - Stage1, Stage 2, Stage 3, Stage 4, Unstageable, Unspecified, Unable to Clinically Determine  Other  Clinically Undetermined  Supporting Information: 04/28/12 pressure ulcer noted 04/30/15 provider note: Pressure ulcer   Please exercise your independent, professional judgment when responding. A specific answer is not anticipated or expected. Please update your documentation within the medical record to reflect your response to this query. Thank you  Thank Barrie Dunker Health Information Management [place] [phone]

## 2015-05-01 NOTE — Progress Notes (Signed)
Subjective: 3 Days Post-Op Procedure(s) (LRB): ARTHROPLASTY BIPOLAR HIP (HEMIARTHROPLASTY  WITH HARDWARE REMOVAL,  (Left) Patient reports pain as mild.    Objective: Vital signs in last 24 hours: Temp:  [98.4 F (36.9 C)-99.1 F (37.3 C)] 98.4 F (36.9 C) (02/14 0436) Pulse Rate:  [93-100] 93 (02/14 0436) Resp:  [17-18] 18 (02/14 0436) BP: (139-149)/(57-64) 149/57 mmHg (02/14 0436) SpO2:  [95 %-97 %] 97 % (02/14 0436)  Intake/Output from previous day: 02/13 0701 - 02/14 0700 In: 480 [P.O.:480] Out: -  Intake/Output this shift: Total I/O In: 240 [P.O.:240] Out: -    Recent Labs  04/28/15 1547 04/29/15 0643 04/30/15 0437 05/01/15 0632  HGB 9.5* 8.1* 10.0* 10.3*    Recent Labs  04/30/15 0437 05/01/15 0632  WBC 13.6* 15.0*  RBC 3.37* 3.32*  HCT 29.9* 30.3*  PLT 268 270    Recent Labs  04/28/15 1547 04/29/15 0643  NA 140 137  K 4.7 4.3  CL  --  101  CO2  --  21*  BUN  --  14  CREATININE  --  0.78  GLUCOSE 115* 144*  CALCIUM  --  8.6*   No results for input(s): LABPT, INR in the last 72 hours. Left hip exam:  Neurovascular intact Sensation intact distally Intact pulses distally Dorsiflexion/Plantar flexion intact Incision: dressing C/D/I Compartment soft  Assessment/Plan: 3 Days Post-Op Procedure(s) (LRB): ARTHROPLASTY BIPOLAR HIP (HEMIARTHROPLASTY  WITH HARDWARE REMOVAL,  (Left) Plan: Eliquis for DVT prophylaxis. Norco 5 mg as needed for pain Weight-bear as tolerated on left with posterior hip precautions. Up with therapy Discharge to SNF Follow-up with Dr. Luiz Blare in 2 weeks. Amber Fisher G 05/01/2015, 9:50 AM

## 2015-05-01 NOTE — Discharge Summary (Signed)
Physician Discharge Summary  Amber Fisher ZOX:[id number] DOB: 1933/10/04 DOA: 04/27/2015  PCP: Lonia Blood, MD  Admit date: 04/27/2015 Discharge date: 05/01/2015  Time spent: > 30 minutes  Recommendations for Outpatient Follow-up:  1. Follow up with Dr. Luiz Blare in 2 weeks 2. Discharge to SNF 3. 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   Pressure ulcer   Painful orthopaedic hardware   Acute blood loss anemia   DVT (deep venous thrombosis) (HCC)  Discharge Condition: stable  Diet recommendation: regular  Filed Weights   04/28/15 0235  Weight: 60.791 kg (134 lb 0.3 oz)   History of present illness:  See H&P, Labs, Consult and Test reports for all details in brief, patient is a 82 year old female from [place], who fell about 4 months ago and broke her hip, and she underwent repair he [place] with 3 screws, with persistent associated angulation deformity, patient in severe pain over the last 4 months and unable to ambulate.  Hospital Course:  Left hip fracture - s/p hemiarthroplasty with hardware removal on left 2/11 by Dr. Luiz Blare, doing well post op, PT recommending SNF.  Left lower extremity DVT, acute - Likely due to immobility as well as long flight from [place] shortly before admission, started on Eliquis, tolerating well, continue 10 BID until 2/19 and transition to 5 BID at that time.  Heart murmur - asymptomatic, consider a 2D echo as an outpatient.  Leukocytosis - Likely reactive, resolved Cough - mild, CXR clear, IS ABLA - Hb down to 8.1, transfused 2U pRBC on 2/12, hemoglobin improved to 10 and remained stable.    Procedures:  hemiarthroplasty with hardware removal on left 2/11 by Dr. Luiz Blare   Consultations:  Orthopedic surgery - Dr. Luiz Blare  Discharge Exam: Filed Vitals:   04/30/15 0300  04/30/15 0557 04/30/15 2046 05/01/15 0436  BP:  121/48 139/64 149/57  Pulse:  104 100 93  Temp:  98.4 F (36.9 C) 99.1 F (37.3 C) 98.4 F (36.9 C)  TempSrc:  Oral Oral Oral  Resp:  Weight:      SpO2: 94% 95% 95% 97%    General: NAD Cardiovascular: RRR Respiratory: CTA biL  Discharge Instructions Activity:  As tolerated   Get Medicines reviewed and adjusted: Please take all your medications with you for your next visit with your Primary MD  Please request your Primary MD to go over all hospital tests and procedure/radiological results at the follow up, please ask your Primary MD to get all Hospital records sent to his/her office.  If you experience worsening of your admission symptoms, develop shortness of breath, life threatening emergency, suicidal or homicidal thoughts you must seek medical attention immediately by calling 911 or calling your MD immediately if symptoms less severe.  You must read complete instructions/literature along with all the possible adverse reactions/side effects for all the Medicines you take and that have been prescribed to you. Take any new Medicines after you have completely understood and accpet all the possible adverse reactions/side effects.   Do not drive when taking Pain medications.   Do not take more than prescribed Pain, Sleep and Anxiety Medications  Special Instructions: If you have smoked or chewed Tobacco in the last 2 yrs please stop smoking, stop any regular Alcohol and or any Recreational drug use.  Wear Seat belts while driving.  Please note  You were cared for by a hospitalist during your hospital stay. Once you are discharged, your primary care physician will handle any further medical issues. Please note that NO REFILLS for any discharge medications will be authorized once you are discharged, as it is imperative that you return to your primary care physician (or establish a relationship with a primary care  physician if you do not have one) for your aftercare needs so that they can reassess your need for medications and monitor your lab values.    Medication List    STOP taking these medications        ondansetron 8 MG disintegrating tablet  Commonly known as:  ZOFRAN ODT      TAKE these medications        apixaban 5 MG Tabs tablet  Commonly known as:  ELIQUIS  Take 2 tablets (10 mg total) by mouth 2 (two) times daily. For 5 days. On 2/19 switch to 5 mg twice daily     baclofen 10 MG tablet  Commonly known as:  LIORESAL  Take 1 tablet (10 mg total) by mouth every 8 (eight) hours as needed for muscle spasms.     bisacodyl 5 MG EC tablet  Commonly known as:  DULCOLAX  Take 1 tablet (5 mg total) by mouth daily as needed for moderate constipation.     ferrous sulfate 325 (65 FE) MG tablet  Take 1 tablet (325 mg total) by mouth daily with breakfast.     HYDROcodone-acetaminophen 5-325 MG tablet  Commonly known as:  NORCO/VICODIN  Take 1-2 tablets by mouth every 6 (six) hours as needed for moderate pain.     OVER THE COUNTER MEDICATION  Take by mouth 2 (two) times daily. Vitamin B complex from [employer] - Vitamin B1, B6, B12     OVER THE COUNTER MEDICATION  Take 50 mg by mouth 2 (two) times daily. Diclofenac potassium 50 mg from [facility] (OTC)     pantoprazole 20 MG tablet  Commonly known as:  PROTONIX  Take 1 tablet (20 mg total) by mouth daily.           Follow-up Information    Schedule an appointment as soon as possible for a visit with Lonia Blood, MD.   Specialty:  Internal Medicine   Contact information:   [address] [place] [address] [phone]       Follow up with [facility].   Why:  Home Health Physcai Therapy and Occupational Therapy 24 -48 hours post discharge   Contact information:   [address] [place] [address] [phone]       Follow up with GRAVES,JOHN L, MD. Schedule an appointment as soon as possible  for a visit in 2 weeks.   Specialty:  Orthopedic Surgery   Contact information:   [address] [place] [address] [phone]       Follow up with [facility].   Specialty:  Skilled Nursing Facility   Contact information:   [address] [place] [address] [phone]      Follow up with Lonia Blood, MD. Schedule an appointment as soon as possible for a visit in 4 weeks.   Specialty:  Internal Medicine   Contact information:   [address] [place] [address] [phone]       The results of significant diagnostics from this hospitalization (including imaging, microbiology, ancillary and laboratory) are listed below for  reference.    Significant Diagnostic Studies: Pelvis Portable  04/28/2015  CLINICAL DATA:  Status post left hip hemiarthroplasty EXAM: PORTABLE PELVIS 1-2 VIEWS COMPARISON:  Plain film of the pelvis and left hip dated 04/27/2015. FINDINGS: Interval revision arthroplasty at the left hip. Hardware appears intact and well positioned. No surgical complicating feature seen. Expected postsurgical changes within the adjacent soft tissues. IMPRESSION: Revision arthroplasty at the left hip. No surgical complicating feature seen. Electronically Signed   By: Bary Richard M.D.   On: 04/28/2015 16:56   Dg Chest Port 1 View  04/29/2015  CLINICAL DATA:  Throat discomfort, cough EXAM: PORTABLE CHEST 1 VIEW COMPARISON:  None. FINDINGS: Scarring in the bilateral upper lobes, left greater than right. Chronic interstitial markings/emphysematous changes. No focal consolidation. No pleural effusion or pneumothorax. The heart is normal in size. Mild degenerative changes of the right shoulder. IMPRESSION: No evidence of acute cardiopulmonary disease. Scarring in the bilateral upper lobes, left greater than right. Electronically Signed   By: Charline Bills M.D.   On: 04/29/2015 12:05   Dg C-arm 1-60 Min-no Report  04/28/2015  CLINICAL DATA: left hip  fracture C-ARM 1-60 MINUTES Fluoroscopy was utilized by the requesting physician.  No radiographic interpretation.   Dg Hip Unilat With Pelvis 2-3 Views Left  04/27/2015  CLINICAL DATA:  Recent left hip surgery 5 months ago, pain ever since in left hip. Very limited range of motion. EXAM: DG HIP (WITH OR WITHOUT PELVIS) 2-3V LEFT COMPARISON:  None. FINDINGS: Three fixation screws traverse a subcapital left femoral neck fracture site. There is a persistent diastasis at the fracture site which measures approximately 9 mm superiorly and perhaps up to 2 cm centrally. Associated angulation deformity at the fracture site. Femoral head remains grossly well positioned relative to the acetabulum. Osseous structures of the pelvis appear intact and well aligned throughout, although diffuse osteopenia limits characterization of osseous detail. Mild degenerative change noted within the lower lumbar spine. Soft tissues about the pelvis and left hip are unremarkable. IMPRESSION: 1. Status post screw fixation of a subcapital left femoral neck fracture. Fairly marked diastasis persists at the fracture site, measuring 9 mm superiorly and up to approximately 2 cm centrally. Associated angulation deformity. Femoral head remains normally positioned relative to the acetabulum. Would consider surgical consult for possible revision. 2. No acute abnormality appreciated within the osseous pelvis. Electronically Signed   By: Bary Richard M.D.   On: 04/27/2015 17:02    Microbiology: Recent Results (from the past 240 hour(s))  Surgical pcr screen     Status: Abnormal   Collection Time: 04/28/15  4:51 AM  Result Value Ref Range Status   MRSA, PCR NEGATIVE NEGATIVE Final   Staphylococcus aureus POSITIVE (A) NEGATIVE Final    Comment:        The Xpert SA Assay (FDA approved for NASAL specimens in patients over 88 years of age), is one component of a comprehensive surveillance program.  Test performance has been validated by  [facility] for patients greater than or equal to 57 year old. It is not intended to diagnose infection nor to guide or monitor treatment.      Labs: Basic Metabolic Panel:  Recent Labs Lab 04/27/15 2133 04/28/15 1547 04/29/15 0643  NA 139 140 137  K 3.8 4.7 4.3  CL 103  --  101  CO2 21*  --  21*  GLUCOSE 85 115* 144*  BUN 14  --  14  CREATININE 0.68  --  0.78  CALCIUM 9.4  --  8.6*   Liver Function Tests:  Recent Labs Lab 04/27/15 2133  AST 23  ALT 15  ALKPHOS 83  BILITOT 0.5  PROT 7.0  ALBUMIN 3.0*   CBC:  Recent Labs Lab 04/27/15 2133 04/28/15 1547 04/29/15 0643 04/30/15 0437 05/01/15 0632  WBC 11.4*  --  9.9 13.6* 15.0*  NEUTROABS 5.4  --   --   --   --   HGB 11.3* 9.5* 8.1* 10.0* 10.3*  HCT 33.4* 28.0* 24.9* 29.9* 30.3*  MCV 89.5  --  88.9 88.7 91.3  PLT 332  --  370 268 270    Signed:  Roselia Snipe  [facility] 05/01/2015, 10:40 AM

## 2015-05-02 ENCOUNTER — Encounter: Payer: Self-pay | Admitting: Adult Health

## 2015-05-02 ENCOUNTER — Non-Acute Institutional Stay (SKILLED_NURSING_FACILITY): Payer: Medicare Other | Admitting: Adult Health

## 2015-05-02 DIAGNOSIS — S72002K Fracture of unspecified part of neck of left femur, subsequent encounter for closed fracture with nonunion: Secondary | ICD-10-CM | POA: Diagnosis not present

## 2015-05-02 DIAGNOSIS — K5901 Slow transit constipation: Secondary | ICD-10-CM

## 2015-05-02 DIAGNOSIS — K219 Gastro-esophageal reflux disease without esophagitis: Secondary | ICD-10-CM | POA: Diagnosis not present

## 2015-05-02 DIAGNOSIS — E46 Unspecified protein-calorie malnutrition: Secondary | ICD-10-CM | POA: Diagnosis not present

## 2015-05-02 DIAGNOSIS — I824Z2 Acute embolism and thrombosis of unspecified deep veins of left distal lower extremity: Secondary | ICD-10-CM | POA: Diagnosis not present

## 2015-05-02 DIAGNOSIS — L8990 Pressure ulcer of unspecified site, unspecified stage: Secondary | ICD-10-CM | POA: Diagnosis not present

## 2015-05-02 DIAGNOSIS — D62 Acute posthemorrhagic anemia: Secondary | ICD-10-CM | POA: Diagnosis not present

## 2015-05-02 NOTE — Progress Notes (Signed)
Patient ID: Amber Fisher, female   DOB: [birth year], 82 y.o.   MRN: [id number]    DATE:  05/02/15  MRN:  [id number]  BIRTHDAY: January 27, 1934  Facility:  Nursing Home Location:  [facility]  Nursing Home Room Number: 808-P  LEVEL OF CARE:  SNF (31)  Contact Information    Name Relation Home Work [address] Son [phone]         Code Status History    Date Active Date Inactive Code Status Order ID Comments User Context   04/28/2015  1:45 AM 05/01/2015  8:54 PM Full Code [id number]  Hillary Bow, DO ED       Chief Complaint  Patient presents with  . Hospitalization Follow-up    HISTORY OF ILLNESS:  This is a 82 year old female from [place] who had a fall 4 months ago and broke her hip. She had a repair in [place] with 3 screws. She had been having severe pain ever since then with minimal ambulation. She flew back to the [place] and she was barely able to ambulate with walker to get off the plane. She came straight from airport to ED. Imaging revealed attempted screw repair with nonunion with marked diastasis at fracture site and angulation deformity. She had left hemiarthroplasty with hardware removal on 04/28/15 performed by Dr. Luiz Blare. She has been admitted for a short-term rehabilitation.  Noted that her Aquacel dressing on her left hip is saturated so wound nurse called Dr. Luiz Blare office and get an order to change Aquacel dressing. No active drainage noted upon removal of dressing. Left hip surgical incision has staples, no erythema.   PAST MEDICAL HISTORY:  Past Medical History  Diagnosis Date  . 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 Pressure ulcer   . Painful orthopaedic hardware   . Acute blood loss anemia   . DVT (deep venous thrombosis) (HCC)   . Heart murmur   . Leukocytosis   . Cough      CURRENT MEDICATIONS: Reviewed  Patient's Medications  New Prescriptions   No medications on file  Previous Medications   APIXABAN  (ELIQUIS) 5 MG TABS TABLET    Take 2 tablets (10 mg total) by mouth 2 (two) times daily. For 5 days. On 2/19 switch to 5 mg twice daily   BACLOFEN (LIORESAL) 10 MG TABLET    Take 1 tablet (10 mg total) by mouth every 8 (eight) hours as needed for muscle spasms.   BISACODYL (DULCOLAX) 5 MG EC TABLET    Take 1 tablet (5 mg total) by mouth daily as needed for moderate constipation.   FERROUS SULFATE 325 (65 FE) MG TABLET    Take 1 tablet (325 mg total) by mouth daily with breakfast.   HYDROCODONE-ACETAMINOPHEN (NORCO/VICODIN) 5-325 MG TABLET    Take 1-2 tablets by mouth every 6 (six) hours as needed for moderate pain.   PANTOPRAZOLE (PROTONIX) 20 MG TABLET    Take 1 tablet (20 mg total) by mouth daily.  Modified Medications   No medications on file  Discontinued Medications   OVER THE COUNTER MEDICATION    Take by mouth 2 (two) times daily. Vitamin B complex from [employer] - Vitamin B1, B6, B12   OVER THE COUNTER MEDICATION    Take 50 mg by mouth 2 (two) times daily. Diclofenac potassium 50 mg from [facility] (OTC)     No Known Allergies   REVIEW OF SYSTEMS:  GENERAL: no change in appetite, no  fatigue, no weight changes, no fever, chills or weakness EYES: Denies change in vision, dry eyes, eye pain, itching or discharge EARS: Denies change in hearing, ringing in ears, or earache NOSE: Denies nasal congestion or epistaxis MOUTH and THROAT: Denies oral discomfort, gingival pain or bleeding, pain from teeth or hoarseness   RESPIRATORY: no cough, SOB, DOE, wheezing, hemoptysis CARDIAC: no chest pain, edema or palpitations GI: no abdominal pain, diarrhea, constipation, heart burn, nausea or vomiting GU: Denies dysuria, frequency, hematuria, incontinence, or discharge PSYCHIATRIC: Denies feeling of depression or anxiety. No report of hallucinations, insomnia, paranoia, or agitation   PHYSICAL EXAMINATION  GENERAL APPEARANCE:  In no acute distress. Normal body habitus SKIN:  Left hip surgical  incision with staples, dry, no erythema; lrft heel with unstageable pressure ulcer HEAD: Normal in size and contour. No evidence of trauma EYES: Lids open and close normally. No blepharitis, entropion or ectropion. PERRL. Conjunctivae are clear and sclerae are white. Lenses are without opacity EARS: Pinnae are normal. Patient hears normal voice tunes of the examiner MOUTH and THROAT: Lips are without lesions. Oral mucosa is moist and without lesions. Tongue is normal in shape, size, and color and without lesions NECK: supple, trachea midline, no neck masses, no thyroid tenderness, no thyromegaly LYMPHATICS: no LAN in the neck, no supraclavicular LAN RESPIRATORY: breathing is even & unlabored, BS CTAB CARDIAC: RRR, + murmur,no extra heart sounds, no edema GI: abdomen soft, normal BS, no masses, no tenderness, no hepatomegaly, no splenomegaly EXTREMITIES:  Able to move 4 extremities PSYCHIATRIC: Alert and oriented X 3. Affect and behavior are appropriate  LABS/RADIOLOGY: Labs reviewed: Basic Metabolic Panel:  Recent Labs  11/91/47 0536 04/27/15 2133 04/28/15 1547 04/29/15 0643  NA 137 139 140 137  K 3.6 3.8 4.7 4.3  CL 100 103  --  101  CO2 28 21*  --  21*  GLUCOSE 107* 85 115* 144*  BUN 11 14  --  14  CREATININE 0.82 0.68  --  0.78  CALCIUM 9.0 9.4  --  8.6*   Liver Function Tests:  Recent Labs  04/27/15 2133  AST 23  ALT 15  ALKPHOS 83  BILITOT 0.5  PROT 7.0  ALBUMIN 3.0*   CBC:  Recent Labs  05/11/14 0536 04/27/15 2133  04/29/15 0643 04/30/15 0437 05/01/15 0632  WBC 12.1* 11.4*  --  9.9 13.6* 15.0*  NEUTROABS 8.6* 5.4  --   --   --   --   HGB 12.4 11.3*  < > 8.1* 10.0* 10.3*  HCT 35.9* 33.4*  < > 24.9* 29.9* 30.3*  MCV 92.1 89.5  --  88.9 88.7 91.3  PLT 435* 332  --  370 268 270  < > = values in this interval not displayed.   Pelvis Portable  04/28/2015  CLINICAL DATA:  Status post left hip hemiarthroplasty EXAM: PORTABLE PELVIS 1-2 VIEWS COMPARISON:   Plain film of the pelvis and left hip dated 04/27/2015. FINDINGS: Interval revision arthroplasty at the left hip. Hardware appears intact and well positioned. No surgical complicating feature seen. Expected postsurgical changes within the adjacent soft tissues. IMPRESSION: Revision arthroplasty at the left hip. No surgical complicating feature seen. Electronically Signed   By: Bary Richard M.D.   On: 04/28/2015 16:56   Dg Chest Port 1 View  04/29/2015  CLINICAL DATA:  Throat discomfort, cough EXAM: PORTABLE CHEST 1 VIEW COMPARISON:  None. FINDINGS: Scarring in the bilateral upper lobes, left greater than right. Chronic interstitial markings/emphysematous changes. No  focal consolidation. No pleural effusion or pneumothorax. The heart is normal in size. Mild degenerative changes of the right shoulder. IMPRESSION: No evidence of acute cardiopulmonary disease. Scarring in the bilateral upper lobes, left greater than right. Electronically Signed   By: Charline Bills M.D.   On: 04/29/2015 12:05   Dg C-arm 1-60 Min-no Report  04/28/2015  CLINICAL DATA: left hip fracture C-ARM 1-60 MINUTES Fluoroscopy was utilized by the requesting physician.  No radiographic interpretation.   Dg Hip Unilat With Pelvis 2-3 Views Left  04/27/2015  CLINICAL DATA:  Recent left hip surgery 5 months ago, pain ever since in left hip. Very limited range of motion. EXAM: DG HIP (WITH OR WITHOUT PELVIS) 2-3V LEFT COMPARISON:  None. FINDINGS: Three fixation screws traverse a subcapital left femoral neck fracture site. There is a persistent diastasis at the fracture site which measures approximately 9 mm superiorly and perhaps up to 2 cm centrally. Associated angulation deformity at the fracture site. Femoral head remains grossly well positioned relative to the acetabulum. Osseous structures of the pelvis appear intact and well aligned throughout, although diffuse osteopenia limits characterization of osseous detail. Mild degenerative  change noted within the lower lumbar spine. Soft tissues about the pelvis and left hip are unremarkable. IMPRESSION: 1. Status post screw fixation of a subcapital left femoral neck fracture. Fairly marked diastasis persists at the fracture site, measuring 9 mm superiorly and up to approximately 2 cm centrally. Associated angulation deformity. Femoral head remains normally positioned relative to the acetabulum. Would consider surgical consult for possible revision. 2. No acute abnormality appreciated within the osseous pelvis. Electronically Signed   By: Bary Richard M.D.   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 Luiz Blare, orthopedic surgeon, in 2 weeks  Pressure ulcer on left heel - unstageable; treatment daily and start Prevalon boot   Anemia, acute blood loss - S/P transfusion of 2 units packed RBC; hemoglobin 10.3; continue for sulfate 325 mg 1 tab by mouth daily; check CBC  DVT LLE - continue on a cruise 10 mg twice a day till 2/19 then 5 mg twice a day  Constipation - continue Dulcolax 5 mg 1 tab by mouth daily when necessary  GERD - continue Protonix 20 mg 1 tab by mouth daily  Protein calorie malnutrition - albumin 3.0; start Procel 2 scoops by mouth twice a day; check BMP     Goals of care:  Short-term rehabilitation     [facility], NP [facility] [phone]

## 2015-05-03 LAB — BASIC METABOLIC PANEL
BUN: 9 mg/dL (ref 4–21)
CREATININE: 0.6 mg/dL (ref 0.5–1.1)
Glucose: 108 mg/dL
Potassium: 4.2 mmol/L (ref 3.4–5.3)
Sodium: 141 mmol/L (ref 137–147)

## 2015-05-03 LAB — CBC AND DIFFERENTIAL
HCT: 30 % — AB (ref 36–46)
Hemoglobin: 9.9 g/dL — AB (ref 12.0–16.0)
PLATELETS: 384 10*3/uL (ref 150–399)
WBC: 11.6 10*3/mL

## 2015-05-07 ENCOUNTER — Non-Acute Institutional Stay (SKILLED_NURSING_FACILITY): Payer: Medicare Other | Admitting: Internal Medicine

## 2015-05-07 ENCOUNTER — Encounter: Payer: Self-pay | Admitting: Internal Medicine

## 2015-05-07 DIAGNOSIS — K219 Gastro-esophageal reflux disease without esophagitis: Secondary | ICD-10-CM | POA: Diagnosis not present

## 2015-05-07 DIAGNOSIS — I82402 Acute embolism and thrombosis of unspecified deep veins of left lower extremity: Secondary | ICD-10-CM | POA: Diagnosis not present

## 2015-05-07 DIAGNOSIS — R2681 Unsteadiness on feet: Secondary | ICD-10-CM

## 2015-05-07 DIAGNOSIS — R5381 Other malaise: Secondary | ICD-10-CM | POA: Diagnosis not present

## 2015-05-07 DIAGNOSIS — S72002K Fracture of unspecified part of neck of left femur, subsequent encounter for closed fracture with nonunion: Secondary | ICD-10-CM

## 2015-05-07 DIAGNOSIS — K5900 Constipation, unspecified: Secondary | ICD-10-CM | POA: Diagnosis not present

## 2015-05-07 DIAGNOSIS — D62 Acute posthemorrhagic anemia: Secondary | ICD-10-CM

## 2015-05-07 DIAGNOSIS — L8990 Pressure ulcer of unspecified site, unspecified stage: Secondary | ICD-10-CM

## 2015-05-07 DIAGNOSIS — E46 Unspecified protein-calorie malnutrition: Secondary | ICD-10-CM | POA: Diagnosis not present

## 2015-05-07 NOTE — Progress Notes (Signed)
Patient ID: Amber Fisher, female   DOB: December 14, 1933, 82 y.o.   MRN: [id number]    LOCATION: [facility]  PCP: Lonia Blood, MD   Code Status: Full Code  Goals of care: Advanced Directive information Advanced Directives 04/28/2015  Does patient have an advance directive? No  Would patient like information on creating an advanced directive? -    Extended Emergency Contact Information Primary Emergency Contact: Samatar,Ibrahim Address: [address]          [place], [place] [address] [place] Home Phone: [phone] Relation: Son   No Known Allergies  Chief Complaint  Patient presents with  . New Admit To SNF    New Admission     HPI:  Patient is a 82 y.o. 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 She had undergone surgical repair for this 4 months back in [place] with deformity. She underwent hemiarthroplasty with hardware removal on 04/28/15. She was also diagnosed to have acute left lower extremity DVT and was started on eliquis. She required 2 u prbc transfusion for acute blood loss anemia. She is seen in her room today with her son and niece present.   Review of Systems: son and niece are interpreting for her Constitutional: Negative for fever and diaphoresis. positive for easy fatigue HENT: Negative for headache, congestion, nasal discharge Eyes: Negative for blurred vision, double vision and discharge.  Respiratory: Negative for cough, shortness of breath and wheezing.   Cardiovascular: Negative for chest pain, palpitations, leg swelling.  Gastrointestinal: Negative for vomiting, abdominal pain. Had bowel movement yesterday. Has been nauseous and has heartburn Genitourinary: Negative for dysuria Musculoskeletal: Negative for fall in the facility Skin: Negative for itching, rash.  Neurological: positive for occasional  dizziness Psychiatric/Behavioral: Negative for depression   Past Medical History  Diagnosis Date  . 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 Pressure ulcer   . Painful orthopaedic hardware   . Acute blood loss anemia   . DVT (deep venous thrombosis) (HCC)   . Heart murmur   . Leukocytosis   . Cough    Past Surgical History  Procedure Laterality Date  . Hip surgery    . Hip arthroplasty Left 04/28/2015    Procedure: ARTHROPLASTY BIPOLAR HIP (HEMIARTHROPLASTY  WITH HARDWARE REMOVAL, ;  Surgeon: Jodi Geralds, MD;  Location: [facility] OR;  Service: Orthopedics;  Laterality: Left;   Social History:   reports that she has never smoked. She does not have any smokeless tobacco history on file. She reports that she does not drink alcohol or use illicit drugs.  History reviewed. No pertinent family history.  Medications:   Medication List       This list is accurate as of: 05/07/15 10:50 AM.  Always use your most recent med list.               baclofen 10 MG tablet  Commonly known as:  LIORESAL  Take 1 tablet (10 mg total) by mouth every 8 (eight) hours as needed for muscle spasms.     bisacodyl 5 MG EC tablet  Commonly known as:  DULCOLAX  Take 1 tablet (5 mg total) by mouth daily as needed for moderate constipation.     ferrous sulfate 325 (65 FE) MG tablet  Take 1 tablet (325 mg total) by mouth daily with breakfast.     HYDROcodone-acetaminophen 5-325 MG tablet  Commonly known as:  NORCO/VICODIN  Take 1-2 tablets by mouth every 6 (six) hours as needed for moderate pain.     pantoprazole 20 MG tablet  Commonly known as:  PROTONIX  Take 1 tablet (20 mg total) by mouth daily.        Immunizations: Immunization History  Administered Date(s) Administered  . PPD Test 05/01/2015     Physical Exam: Filed Vitals:   05/07/15 1041  BP: 140/71  Pulse: 90  Temp: 98.4 F (36.9 C)  TempSrc: Oral  Resp: 18  Weight: 124 lb 3.2 oz (56.337 kg)  SpO2: 97%   There is no  height on file to calculate BMI.  General- elderly female, thin built, in no acute distress Head- normocephalic, atraumatic Nose- no maxillary or frontal sinus tenderness, no nasal discharge Throat- moist mucus membrane Eyes- PERRLA, EOMI, no pallor, no icterus, no discharge, normal conjunctiva, normal sclera Neck- no cervical lymphadenopathy Cardiovascular- normal s1,s2, no murmurs, palpable dorsalis pedis and radial pulses, trace leg edema Respiratory- bilateral clear to auscultation Abdomen- bowel sounds present, soft, non tender Musculoskeletal- able to move all 4 extremities, limited left hip range of motion Neurological-  alert and oriented to person, place and time Skin- warm and dry, left hip surgical incision with staples and healing well, trace left foot edema, left heel unstageable pressure ulcer Psychiatry- normal mood and affect    Labs reviewed: Basic Metabolic Panel:  Recent Labs  40/98/11 0536 04/27/15 2133 04/28/15 1547 04/29/15 0643 05/03/15  NA 137 139 140 137 141  K 3.6 3.8 4.7 4.3 4.2  CL 100 103  --  101  --   CO2 28 21*  --  21*  --   GLUCOSE 107* 85 115* 144*  --   BUN 11 14  --  14 9  CREATININE 0.82 0.68  --  0.78 0.6  CALCIUM 9.0 9.4  --  8.6*  --    Liver Function Tests:  Recent Labs  04/27/15 2133  AST 23  ALT 15  ALKPHOS 83  BILITOT 0.5  PROT 7.0  ALBUMIN 3.0*   No results for input(s): LIPASE, AMYLASE in the last 8760 hours. No results for input(s): AMMONIA in the last 8760 hours. CBC:  Recent Labs  05/11/14 0536 04/27/15 2133  04/29/15 0643 04/30/15 0437 05/01/15 0632 05/03/15  WBC 12.1* 11.4*  --  9.9 13.6* 15.0* 11.6  NEUTROABS 8.6* 5.4  --   --   --   --   --   HGB 12.4 11.3*  < > 8.1* 10.0* 10.3* 9.9*  HCT 35.9* 33.4*  < > 24.9* 29.9* 30.3* 30*  MCV 92.1 89.5  --  88.9 88.7 91.3  --   PLT 435* 332  --  370 268 270 384  < > = values in this interval not displayed.  Radiological Exams: Pelvis Portable  04/28/2015   CLINICAL DATA:  Status post left hip hemiarthroplasty EXAM: PORTABLE PELVIS 1-2 VIEWS COMPARISON:  Plain film of the pelvis and left hip dated 04/27/2015. FINDINGS: Interval revision arthroplasty at the left hip. Hardware appears intact and well positioned. No surgical complicating feature seen. Expected postsurgical changes within the adjacent soft tissues. IMPRESSION: Revision arthroplasty at the left hip. No surgical complicating feature seen. Electronically Signed   By: Bary Richard M.D.   On: 04/28/2015 16:56   Dg Chest Port 1 View  04/29/2015  CLINICAL DATA:  Throat discomfort, cough EXAM: PORTABLE CHEST 1 VIEW COMPARISON:  None. FINDINGS: Scarring in the bilateral upper lobes,  left greater than right. Chronic interstitial markings/emphysematous changes. No focal consolidation. No pleural effusion or pneumothorax. The heart is normal in size. Mild degenerative changes of the right shoulder. IMPRESSION: No evidence of acute cardiopulmonary disease. Scarring in the bilateral upper lobes, left greater than right. Electronically Signed   By: Charline Bills M.D.   On: 04/29/2015 12:05   Dg C-arm 1-60 Min-no Report  04/28/2015  CLINICAL DATA: left hip fracture C-ARM 1-60 MINUTES Fluoroscopy was utilized by the requesting physician.  No radiographic interpretation.   Dg Hip Unilat With Pelvis 2-3 Views Left  04/27/2015  CLINICAL DATA:  Recent left hip surgery 5 months ago, pain ever since in left hip. Very limited range of motion. EXAM: DG HIP (WITH OR WITHOUT PELVIS) 2-3V LEFT COMPARISON:  None. FINDINGS: Three fixation screws traverse a subcapital left femoral neck fracture site. There is a persistent diastasis at the fracture site which measures approximately 9 mm superiorly and perhaps up to 2 cm centrally. Associated angulation deformity at the fracture site. Femoral head remains grossly well positioned relative to the acetabulum. Osseous structures of the pelvis appear intact and well aligned  throughout, although diffuse osteopenia limits characterization of osseous detail. Mild degenerative change noted within the lower lumbar spine. Soft tissues about the pelvis and left hip are unremarkable. IMPRESSION: 1. Status post screw fixation of a subcapital left femoral neck fracture. Fairly marked diastasis persists at the fracture site, measuring 9 mm superiorly and up to approximately 2 cm centrally. Associated angulation deformity. Femoral head remains normally positioned relative to the acetabulum. Would consider surgical consult for possible revision. 2. No acute abnormality appreciated within the osseous pelvis. Electronically Signed   By: Bary Richard M.D.   On: 04/27/2015 17:02    Assessment/Plan  Unsteady gait Post fracture of her left hip, s/p surgical repair. Will have patient work with PT/OT as tolerated to regain strength and restore function.  Fall precautions are in place.  Physical deconditioning With her left hip fracture requiring surgical repair and her blood loss anemia. 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 Continue Norco 5/325 mg 1-2 tabs q6h prn pain and baclofen 10 mg q8h prn muscle spasm. Will have her work with physical therapy and occupational therapy team to help with gait training and muscle strengthening exercises.fall precautions. Skin care. Encourage to be out of bed. Has f/u with orthopedics.  Left lower extremity DVT Continue eliquis 5 mg bid for now and monitor  Blood loss anemia S/p 2 u prbc in hospital, Hb low but stable. Recheck in 1 week. Continue feso4  unstageable left heel pressure ulcer Continue wound care and prevalon boot to help relief pressure. Continue decubivite  Protein calorie malnutrition Get dietary consult, continue procel for now  gerd Change protonix to 20 mg bid for now  Constipation continue senna s and miralax scheduled  with Dulcolax 5 mg daily as needed   Goals of care: short term rehabilitation   Labs/tests ordered: cbc in 1 week  Family/ staff Communication: reviewed care plan with patient, son and nursing supervisor    Oneal Grout, MD Internal Medicine [facility] [address] [place], [place] [address] Cell Phone (Monday-Friday 8 am - 5 pm): [phone] On Call: [phone] and follow prompts after 5 pm and on weekends Office Phone: [phone] Office Fax: [phone]

## 2015-05-08 ENCOUNTER — Encounter: Payer: Self-pay | Admitting: Adult Health

## 2015-05-08 ENCOUNTER — Non-Acute Institutional Stay (SKILLED_NURSING_FACILITY): Payer: Medicare Other | Admitting: Adult Health

## 2015-05-08 DIAGNOSIS — E46 Unspecified protein-calorie malnutrition: Secondary | ICD-10-CM

## 2015-05-08 DIAGNOSIS — S72002K Fracture of unspecified part of neck of left femur, subsequent encounter for closed fracture with nonunion: Secondary | ICD-10-CM | POA: Diagnosis not present

## 2015-05-08 DIAGNOSIS — K219 Gastro-esophageal reflux disease without esophagitis: Secondary | ICD-10-CM | POA: Diagnosis not present

## 2015-05-08 DIAGNOSIS — L8990 Pressure ulcer of unspecified site, unspecified stage: Secondary | ICD-10-CM | POA: Diagnosis not present

## 2015-05-08 DIAGNOSIS — D62 Acute posthemorrhagic anemia: Secondary | ICD-10-CM

## 2015-05-08 DIAGNOSIS — I824Z2 Acute embolism and thrombosis of unspecified deep veins of left distal lower extremity: Secondary | ICD-10-CM

## 2015-05-08 DIAGNOSIS — K5901 Slow transit constipation: Secondary | ICD-10-CM | POA: Diagnosis not present

## 2015-05-08 NOTE — Progress Notes (Deleted)
Patient ID: Amber Fisher, female   DOB: 02-29-1934, 82 y.o.   MRN: [id number]    DATE:  05/08/15  MRN:  [id number]  BIRTHDAY: Jul 18, 1933  Facility:  Nursing Home Location:  [facility]  Nursing Home Room Number: [id number]  LEVEL OF CARE:  SNF [phone])  Contact Information    Name Relation Home Work Mobile   [place] Son [phone]     Alain Marion [phone]  [phone]       Code Status History    Date Active Date Inactive Code Status Order ID Comments User Context   04/28/2015  1:45 AM 05/01/2015  8:54 PM Full Code [id number]  Hillary Bow, DO ED       Chief Complaint  Patient presents with  . Discharge Note    HISTORY OF ILLNESS:  This is a 82 year old female who is for discharge home with home health PT for endurance, OT for ADLs and CNA for showers. Her Protonix was recently increased to 20 mg twice a day for GERD. She was recently started on senna S and relax for constipation.  She from [place] who had a fall 4 months ago and broke her hip. She had a repair in [place] with 3 screws. She had been having severe pain ever since then with minimal ambulation. She flew back to the [place] and she was barely able to ambulate with walker to get off the plane. She came straight from airport to ED. Imaging revealed attempted screw repair with nonunion with marked diastasis at fracture site and angulation deformity. She had left hemiarthroplasty with hardware removal on 04/28/15 performed by Dr. Luiz Blare. She has been admitted for a short-term rehabilitation.  Patient was admitted to this facility for short-term rehabilitation after the patient's recent hospitalization.  Patient has completed SNF rehabilitation and therapy has cleared the patient for discharge.   PAST MEDICAL HISTORY:  Past Medical History  Diagnosis Date  . 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 Pressure ulcer   . Painful orthopaedic hardware   . Acute blood loss anemia   .  DVT (deep venous thrombosis) (HCC)   . Heart murmur   . Leukocytosis   . Cough      CURRENT MEDICATIONS: Reviewed  Patient's Medications  New Prescriptions   No medications on file  Previous Medications   ACETAMINOPHEN (TYLENOL) 325 MG TABLET    Take by mouth every 4 (four) hours as needed for fever.   APIXABAN (ELIQUIS) 5 MG TABS TABLET    Take 5 mg by mouth 2 (two) times daily.   BACLOFEN (LIORESAL) 10 MG TABLET    Take 1 tablet (10 mg total) by mouth every 8 (eight) hours as needed for muscle spasms.   BISACODYL (DULCOLAX) 5 MG EC TABLET    Take 1 tablet (5 mg total) by mouth daily as needed for moderate constipation.   FERROUS SULFATE 325 (65 FE) MG TABLET    Take 1 tablet (325 mg total) by mouth daily with breakfast.   HYDROCODONE-ACETAMINOPHEN (NORCO/VICODIN) 5-325 MG TABLET    Take 1-2 tablets by mouth every 6 (six) hours as needed for moderate pain.   MULTIPLE VITAMINS-MINERALS (DECUBI-VITE) CAPS    Take 1 capsule by mouth daily.   PANTOPRAZOLE (PROTONIX) 20 MG TABLET    Take 20 mg by mouth 2 (two) times daily.   POLYETHYLENE GLYCOL (MIRALAX / GLYCOLAX) PACKET    Take 17 g by mouth 2 (two) times daily.   PROTEIN (  PROCEL) POWD    Take 2 scoop by mouth 2 (two) times daily.   SENNOSIDES-DOCUSATE SODIUM (SENOKOT-S) 8.6-50 MG TABLET    Take 2 tablets by mouth 2 (two) times daily.  Modified Medications   No medications on file  Discontinued Medications   PANTOPRAZOLE (PROTONIX) 20 MG TABLET    Take 1 tablet (20 mg total) by mouth daily.     No Known Allergies   REVIEW OF SYSTEMS:  GENERAL: no change in appetite, no fatigue, no weight changes, no fever, chills or weakness EYES: Denies change in vision, dry eyes, eye pain, itching or discharge EARS: Denies change in hearing, ringing in ears, or earache NOSE: Denies nasal congestion or epistaxis MOUTH and THROAT: Denies oral discomfort, gingival pain or bleeding, pain from teeth or hoarseness   RESPIRATORY: no cough, SOB, DOE,  wheezing, hemoptysis CARDIAC: no chest pain, edema or palpitations GI: no abdominal pain, diarrhea, constipation, heart burn, nausea or vomiting GU: Denies dysuria, frequency, hematuria, incontinence, or discharge PSYCHIATRIC: Denies feeling of depression or anxiety. No report of hallucinations, insomnia, paranoia, or agitation   PHYSICAL EXAMINATION  GENERAL APPEARANCE:  In no acute distress. Normal body habitus SKIN:  Left hip surgical incision with staples, covered with Xeroform  and dry dressing, no erythema; leftt heel with unstageable pressure ulcer HEAD: Normal in size and contour. No evidence of trauma EYES: Lids open and close normally. No blepharitis, entropion or ectropion. PERRL. Conjunctivae are clear and sclerae are white. Lenses are without opacity EARS: Pinnae are normal. Patient hears normal voice tunes of the examiner MOUTH and THROAT: Lips are without lesions. Oral mucosa is moist and without lesions. Tongue is normal in shape, size, and color and without lesions NECK: supple, trachea midline, no neck masses, no thyroid tenderness, no thyromegaly LYMPHATICS: no LAN in the neck, no supraclavicular LAN RESPIRATORY: breathing is even & unlabored, BS CTAB CARDIAC: RRR, + murmur,no extra heart sounds, no edema GI: abdomen soft, normal BS, no masses, no tenderness, no hepatomegaly, no splenomegaly EXTREMITIES:  Able to move 4 extremities PSYCHIATRIC: Alert and oriented X 3. Affect and behavior are appropriate  LABS/RADIOLOGY: Labs reviewed: Basic Metabolic Panel:  Recent Labs  16/10/96 0536 04/27/15 2133 04/28/15 1547 04/29/15 0643 05/03/15  NA 137 139 140 137 141  K 3.6 3.8 4.7 4.3 4.2  CL 100 103  --  101  --   CO2 28 21*  --  21*  --   GLUCOSE 107* 85 115* 144*  --   BUN 11 14  --  14 9  CREATININE 0.82 0.68  --  0.78 0.6  CALCIUM 9.0 9.4  --  8.6*  --    Liver Function Tests:  Recent Labs  04/27/15 2133  AST 23  ALT 15  ALKPHOS 83  BILITOT 0.5   PROT 7.0  ALBUMIN 3.0*   CBC:  Recent Labs  05/11/14 0536 04/27/15 2133  04/29/15 0643 04/30/15 0437 05/01/15 0632 05/03/15  WBC 12.1* 11.4*  --  9.9 13.6* 15.0* 11.6  NEUTROABS 8.6* 5.4  --   --   --   --   --   HGB 12.4 11.3*  < > 8.1* 10.0* 10.3* 9.9*  HCT 35.9* 33.4*  < > 24.9* 29.9* 30.3* 30*  MCV 92.1 89.5  --  88.9 88.7 91.3  --   PLT 435* 332  --  370 268 270 384  < > = values in this interval not displayed.   Pelvis Portable  04/28/2015  CLINICAL DATA:  Status post left hip hemiarthroplasty EXAM: PORTABLE PELVIS 1-2 VIEWS COMPARISON:  Plain film of the pelvis and left hip dated 04/27/2015. FINDINGS: Interval revision arthroplasty at the left hip. Hardware appears intact and well positioned. No surgical complicating feature seen. Expected postsurgical changes within the adjacent soft tissues. IMPRESSION: Revision arthroplasty at the left hip. No surgical complicating feature seen. Electronically Signed   By: Bary Richard M.D.   On: 04/28/2015 16:56   Dg Chest Port 1 View  04/29/2015  CLINICAL DATA:  Throat discomfort, cough EXAM: PORTABLE CHEST 1 VIEW COMPARISON:  None. FINDINGS: Scarring in the bilateral upper lobes, left greater than right. Chronic interstitial markings/emphysematous changes. No focal consolidation. No pleural effusion or pneumothorax. The heart is normal in size. Mild degenerative changes of the right shoulder. IMPRESSION: No evidence of acute cardiopulmonary disease. Scarring in the bilateral upper lobes, left greater than right. Electronically Signed   By: Charline Bills M.D.   On: 04/29/2015 12:05   Dg C-arm 1-60 Min-no Report  04/28/2015  CLINICAL DATA: left hip fracture C-ARM 1-60 MINUTES Fluoroscopy was utilized by the requesting physician.  No radiographic interpretation.   Dg Hip Unilat With Pelvis 2-3 Views Left  04/27/2015  CLINICAL DATA:  Recent left hip surgery 5 months ago, pain ever since in left hip. Very limited range of motion. EXAM:  DG HIP (WITH OR WITHOUT PELVIS) 2-3V LEFT COMPARISON:  None. FINDINGS: Three fixation screws traverse a subcapital left femoral neck fracture site. There is a persistent diastasis at the fracture site which measures approximately 9 mm superiorly and perhaps up to 2 cm centrally. Associated angulation deformity at the fracture site. Femoral head remains grossly well positioned relative to the acetabulum. Osseous structures of the pelvis appear intact and well aligned throughout, although diffuse osteopenia limits characterization of osseous detail. Mild degenerative change noted within the lower lumbar spine. Soft tissues about the pelvis and left hip are unremarkable. IMPRESSION: 1. Status post screw fixation of a subcapital left femoral neck fracture. Fairly marked diastasis persists at the fracture site, measuring 9 mm superiorly and up to approximately 2 cm centrally. Associated angulation deformity. Femoral head remains normally positioned relative to the acetabulum. Would consider surgical consult for possible revision. 2. No acute abnormality appreciated within the osseous pelvis. Electronically Signed   By: Bary Richard M.D.   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 Luiz Blare, orthopedic surgeon  Pressure ulcer on left heel - unstageable; treatment daily   Anemia, acute blood loss - S/P transfusion of 2 units packed RBC; hemoglobin 10.3; continue for sulfate 325 mg 1 tab by mouth daily; re-check hgb 9.9; check CBC  DVT LLE - continue Eliquis  5 mg twice a day  Constipation - continue Dulcolax 5 mg 1 tab by mouth daily when necessary  GERD - continue Protonix 20 mg 1 tab by mouth BID  Protein  calorie malnutrition - albumin 3.0; continue Procel 2 scoops by mouth twice a day  Constipation - continue senna S2 tabs by mouth twice a day and MiraLAX 17 g by mouth twice a day      I have filled out patient's discharge paperwork and  written prescriptions.  Patient will receive home health PT, OT and CNA.  Total discharge time: Less than 30 minutes  Discharge time involved coordination of the discharge process with Child psychotherapist, nursing staff and therapy department. Medical justification for home health servicesverified.    [facility], NP [employer] [phone]

## 2015-05-08 NOTE — Progress Notes (Signed)
Patient ID: Amber Fisher, female   DOB: 02/12/1934, 81 y.o.   MRN: [id number]    DATE:  05/08/15  MRN:  [id number]  BIRTHDAY: 05/12/1933  Facility:  Nursing Home Location:  [facility]  Nursing Home Room Number: 808-P  LEVEL OF CARE:  SNF (31)  Contact Information    Name Relation Home Work Mobile   Samatar,Ibrahim Son [phone]     Geedi,Fosia Friend [phone]  [phone]       Code Status History    Date Active Date Inactive Code Status Order ID Comments User Context   04/28/2015  1:45 AM 05/01/2015  8:54 PM Full Code [id number]  Jared M Gardner, DO ED       Chief Complaint  Patient presents with  . Discharge Note    HISTORY OF ILLNESS:  This is a 81-year-old female who is for discharge home with home health PT for endurance, OT for ADLs and CNA for showers. Her Protonix was recently increased to 20 mg twice a day for GERD. She was recently started on senna S and relax for constipation.  She from [place] who had a fall 4 months ago and broke her hip. She had a repair in [place] with 3 screws. She had been having severe pain ever since then with minimal ambulation. [place] back to the [place] and she was barely able to ambulate with walker to get off the plane. She came straight from airport to ED. Imaging revealed attempted screw repair with nonunion with marked diastasis at fracture site and angulation deformity. She had left hemiarthroplasty with hardware removal on 04/28/15 performed by Dr. Graves. She has been admitted for a short-term rehabilitation.  Patient was admitted to this facility for short-term rehabilitation after the patient's recent hospitalization.  Patient has completed SNF rehabilitation and therapy has cleared the patient for discharge.   PAST MEDICAL HISTORY:  Past Medical History  Diagnosis Date  . 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 Pressure ulcer   . Painful orthopaedic hardware   . Acute blood loss anemia   .  DVT (deep venous thrombosis) (HCC)   . Heart murmur   . Leukocytosis   . Cough      CURRENT MEDICATIONS: Reviewed  Patient's Medications  New Prescriptions   No medications on file  Previous Medications   ACETAMINOPHEN (TYLENOL) 325 MG TABLET    Take by mouth every 4 (four) hours as needed for fever.   APIXABAN (ELIQUIS) 5 MG TABS TABLET    Take 5 mg by mouth 2 (two) times daily.   BACLOFEN (LIORESAL) 10 MG TABLET    Take 1 tablet (10 mg total) by mouth every 8 (eight) hours as needed for muscle spasms.   BISACODYL (DULCOLAX) 5 MG EC TABLET    Take 1 tablet (5 mg total) by mouth daily as needed for moderate constipation.   FERROUS SULFATE 325 (65 FE) MG TABLET    Take 1 tablet (325 mg total) by mouth daily with breakfast.   HYDROCODONE-ACETAMINOPHEN (NORCO/VICODIN) 5-325 MG TABLET    Take 1-2 tablets by mouth every 6 (six) hours as needed for moderate pain.   MULTIPLE VITAMINS-MINERALS (DECUBI-VITE) CAPS    Take 1 capsule by mouth daily.   PANTOPRAZOLE (PROTONIX) 20 MG TABLET    Take 20 mg by mouth 2 (two) times daily.   POLYETHYLENE GLYCOL (MIRALAX / GLYCOLAX) PACKET    Take 17 g by mouth 2 (two) times daily.   PROTEIN (  PROCEL) POWD    Take 2 scoop by mouth 2 (two) times daily.   SENNOSIDES-DOCUSATE SODIUM (SENOKOT-S) 8.6-50 MG TABLET    Take 2 tablets by mouth 2 (two) times daily.  Modified Medications   No medications on file  Discontinued Medications   PANTOPRAZOLE (PROTONIX) 20 MG TABLET    Take 1 tablet (20 mg total) by mouth daily.     No Known Allergies   REVIEW OF SYSTEMS:  GENERAL: no change in appetite, no fatigue, no weight changes, no fever, chills or weakness EYES: Denies change in vision, dry eyes, eye pain, itching or discharge EARS: Denies change in hearing, ringing in ears, or earache NOSE: Denies nasal congestion or epistaxis MOUTH and THROAT: Denies oral discomfort, gingival pain or bleeding, pain from teeth or hoarseness   RESPIRATORY: no cough, SOB, DOE,  wheezing, hemoptysis CARDIAC: no chest pain, edema or palpitations GI: no abdominal pain, diarrhea, constipation, heart burn, nausea or vomiting GU: Denies dysuria, frequency, hematuria, incontinence, or discharge PSYCHIATRIC: Denies feeling of depression or anxiety. No report of hallucinations, insomnia, paranoia, or agitation   PHYSICAL EXAMINATION  GENERAL APPEARANCE:  In no acute distress. Normal body habitus SKIN:  Left hip surgical incision with staples, covered with Xeroform  and dry dressing, no erythema; leftt heel with unstageable pressure ulcer HEAD: Normal in size and contour. No evidence of trauma EYES: Lids open and close normally. No blepharitis, entropion or ectropion. PERRL. Conjunctivae are clear and sclerae are white. Lenses are without opacity EARS: Pinnae are normal. Patient hears normal voice tunes of the examiner MOUTH and THROAT: Lips are without lesions. Oral mucosa is moist and without lesions. Tongue is normal in shape, size, and color and without lesions NECK: supple, trachea midline, no neck masses, no thyroid tenderness, no thyromegaly LYMPHATICS: no LAN in the neck, no supraclavicular LAN RESPIRATORY: breathing is even & unlabored, BS CTAB CARDIAC: RRR, + murmur,no extra heart sounds, no edema GI: abdomen soft, normal BS, no masses, no tenderness, no hepatomegaly, no splenomegaly EXTREMITIES:  Able to move 4 extremities PSYCHIATRIC: Alert and oriented X 3. Affect and behavior are appropriate  LABS/RADIOLOGY: Labs reviewed: Basic Metabolic Panel:  Recent Labs  05/11/14 0536 04/27/15 2133 04/28/15 1547 04/29/15 0643 05/03/15  NA 137 139 140 137 141  K 3.6 3.8 4.7 4.3 4.2  CL 100 103  --  101  --   CO2 28 21*  --  21*  --   GLUCOSE 107* 85 115* 144*  --   BUN 11 14  --  14 9  CREATININE 0.82 0.68  --  0.78 0.6  CALCIUM 9.0 9.4  --  8.6*  --    Liver Function Tests:  Recent Labs  04/27/15 2133  AST 23  ALT 15  ALKPHOS 83  BILITOT 0.5   PROT 7.0  ALBUMIN 3.0*   CBC:  Recent Labs  05/11/14 0536 04/27/15 2133  04/29/15 0643 04/30/15 0437 05/01/15 0632 05/03/15  WBC 12.1* 11.4*  --  9.9 13.6* 15.0* 11.6  NEUTROABS 8.6* 5.4  --   --   --   --   --   HGB 12.4 11.3*  < > 8.1* 10.0* 10.3* 9.9*  HCT 35.9* 33.4*  < > 24.9* 29.9* 30.3* 30*  MCV 92.1 89.5  --  88.9 88.7 91.3  --   PLT 435* 332  --  370 268 270 384  < > = values in this interval not displayed.   Pelvis Portable  04/28/2015    CLINICAL DATA:  Status post left hip hemiarthroplasty EXAM: PORTABLE PELVIS 1-2 VIEWS COMPARISON:  Plain film of the pelvis and left hip dated 04/27/2015. FINDINGS: Interval revision arthroplasty at the left hip. Hardware appears intact and well positioned. No surgical complicating feature seen. Expected postsurgical changes within the adjacent soft tissues. IMPRESSION: Revision arthroplasty at the left hip. No surgical complicating feature seen. Electronically Signed   By: Stan  Maynard M.D.   On: 04/28/2015 16:56   Dg Chest Port 1 View  04/29/2015  CLINICAL DATA:  Throat discomfort, cough EXAM: PORTABLE CHEST 1 VIEW COMPARISON:  None. FINDINGS: Scarring in the bilateral upper lobes, left greater than right. Chronic interstitial markings/emphysematous changes. No focal consolidation. No pleural effusion or pneumothorax. The heart is normal in size. Mild degenerative changes of the right shoulder. IMPRESSION: No evidence of acute cardiopulmonary disease. Scarring in the bilateral upper lobes, left greater than right. Electronically Signed   By: Sriyesh  Krishnan M.D.   On: 04/29/2015 12:05   Dg C-arm 1-60 Min-no Report  04/28/2015  CLINICAL DATA: left hip fracture C-ARM 1-60 MINUTES Fluoroscopy was utilized by the requesting physician.  No radiographic interpretation.   Dg Hip Unilat With Pelvis 2-3 Views Left  04/27/2015  CLINICAL DATA:  Recent left hip surgery 5 months ago, pain ever since in left hip. Very limited range of motion. EXAM:  DG HIP (WITH OR WITHOUT PELVIS) 2-3V LEFT COMPARISON:  None. FINDINGS: Three fixation screws traverse a subcapital left femoral neck fracture site. There is a persistent diastasis at the fracture site which measures approximately 9 mm superiorly and perhaps up to 2 cm centrally. Associated angulation deformity at the fracture site. Femoral head remains grossly well positioned relative to the acetabulum. Osseous structures of the pelvis appear intact and well aligned throughout, although diffuse osteopenia limits characterization of osseous detail. Mild degenerative change noted within the lower lumbar spine. Soft tissues about the pelvis and left hip are unremarkable. IMPRESSION: 1. Status post screw fixation of a subcapital left femoral neck fracture. Fairly marked diastasis persists at the fracture site, measuring 9 mm superiorly and up to approximately 2 cm centrally. Associated angulation deformity. Femoral head remains normally positioned relative to the acetabulum. Would consider surgical consult for possible revision. 2. No acute abnormality appreciated within the osseous pelvis. Electronically Signed   By: Stan  Maynard M.D.   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 Graves, orthopedic surgeon  Pressure ulcer on left heel - unstageable; treatment daily   Anemia, acute blood loss - S/P transfusion of 2 units packed RBC; hemoglobin 10.3; continue for sulfate 325 mg 1 tab by mouth daily; re-check hgb 9.9; check CBC  DVT LLE - continue Eliquis  5 mg twice a day  Constipation - continue Dulcolax 5 mg 1 tab by mouth daily when necessary  GERD - continue Protonix 20 mg 1 tab by mouth BID  Protein  calorie malnutrition - albumin 3.0; continue Procel 2 scoops by mouth twice a day  Constipation - continue senna S2 tabs by mouth twice a day and MiraLAX 17 g by mouth twice a day      I have filled out patient's discharge paperwork and   written prescriptions.  Patient will receive home health PT, OT and CNA.  Total discharge time: Less than 30 minutes  Discharge time involved coordination of the discharge process with social worker, nursing staff and therapy department. Medical justification for home health servicesverified.    MEDINA-VARGAS,MONINA, NP [facility] [phone]  

## 2017-03-22 ENCOUNTER — Other Ambulatory Visit: Payer: Self-pay

## 2017-03-22 ENCOUNTER — Encounter (HOSPITAL_COMMUNITY): Payer: Self-pay | Admitting: Emergency Medicine

## 2017-03-22 ENCOUNTER — Emergency Department (HOSPITAL_COMMUNITY)
Admission: EM | Admit: 2017-03-22 | Discharge: 2017-03-22 | Disposition: A | Discharge status: Home / Self Care | Payer: Medicare Other | Attending: Emergency Medicine | Admitting: Emergency Medicine

## 2017-03-22 ENCOUNTER — Emergency Department (HOSPITAL_COMMUNITY): Payer: Medicare Other

## 2017-03-22 DIAGNOSIS — M1990 Unspecified osteoarthritis, unspecified site: Secondary | ICD-10-CM

## 2017-03-22 DIAGNOSIS — M19012 Primary osteoarthritis, left shoulder: Secondary | ICD-10-CM | POA: Diagnosis not present

## 2017-03-22 DIAGNOSIS — Z79899 Other long term (current) drug therapy: Secondary | ICD-10-CM | POA: Diagnosis not present

## 2017-03-22 DIAGNOSIS — K2900 Acute gastritis without bleeding: Secondary | ICD-10-CM | POA: Diagnosis not present

## 2017-03-22 DIAGNOSIS — R1013 Epigastric pain: Secondary | ICD-10-CM | POA: Diagnosis present

## 2017-03-22 LAB — COMPREHENSIVE METABOLIC PANEL
ALK PHOS: 87 U/L (ref 38–126)
ALT: 10 U/L — ABNORMAL LOW (ref 14–54)
ANION GAP: 11 (ref 5–15)
AST: 26 U/L (ref 15–41)
Albumin: 3.6 g/dL (ref 3.5–5.0)
BILIRUBIN TOTAL: 0.7 mg/dL (ref 0.3–1.2)
BUN: 15 mg/dL (ref 6–20)
CALCIUM: 9.3 mg/dL (ref 8.9–10.3)
CO2: 25 mmol/L (ref 22–32)
Chloride: 99 mmol/L — ABNORMAL LOW (ref 101–111)
Creatinine, Ser: 0.88 mg/dL (ref 0.44–1.00)
GFR, EST NON AFRICAN AMERICAN: 60 mL/min — AB (ref >60)
Glucose, Bld: 110 mg/dL — ABNORMAL HIGH (ref 65–99)
Potassium: 3.1 mmol/L — ABNORMAL LOW (ref 3.5–5.1)
SODIUM: 135 mmol/L (ref 135–145)
TOTAL PROTEIN: 8.8 g/dL — AB (ref 6.5–8.1)

## 2017-03-22 LAB — LIPASE, BLOOD: Lipase: 23 U/L (ref 11–51)

## 2017-03-22 LAB — CBC
HEMATOCRIT: 37.4 % (ref 36.0–46.0)
Hemoglobin: 12.8 g/dL (ref 12.0–15.0)
MCH: 29.8 pg (ref 26.0–34.0)
MCHC: 34.2 g/dL (ref 30.0–36.0)
MCV: 87 fL (ref 78.0–100.0)
Platelets: 470 10*3/uL — ABNORMAL HIGH (ref 150–400)
RBC: 4.3 MIL/uL (ref 3.87–5.11)
RDW: 14.3 % (ref 11.5–15.5)
WBC: 14.8 10*3/uL — AB (ref 4.0–10.5)

## 2017-03-22 LAB — URINALYSIS, ROUTINE W REFLEX MICROSCOPIC
Bilirubin Urine: NEGATIVE
GLUCOSE, UA: NEGATIVE mg/dL
HGB URINE DIPSTICK: NEGATIVE
KETONES UR: NEGATIVE mg/dL
LEUKOCYTES UA: NEGATIVE
Nitrite: NEGATIVE
PH: 6 (ref 5.0–8.0)
PROTEIN: NEGATIVE mg/dL
Specific Gravity, Urine: 1.034 — ABNORMAL HIGH (ref 1.005–1.030)

## 2017-03-22 MED ORDER — POTASSIUM CHLORIDE CRYS ER 20 MEQ PO TBCR
40.0000 meq | EXTENDED_RELEASE_TABLET | Freq: Once | ORAL | Status: AC
  Administered 2017-03-22: 40 meq via ORAL
  Filled 2017-03-22: qty 2

## 2017-03-22 MED ORDER — ONDANSETRON HCL 4 MG/2ML IJ SOLN
4.0000 mg | Freq: Once | INTRAMUSCULAR | Status: AC
  Administered 2017-03-22: 4 mg via INTRAVENOUS
  Filled 2017-03-22: qty 2

## 2017-03-22 MED ORDER — SUCRALFATE 1 G PO TABS: 1.0000 g | ORAL_TABLET | Freq: Three times a day (TID) | ORAL | 0 refills | Status: DC

## 2017-03-22 MED ORDER — IOPAMIDOL (ISOVUE-300) INJECTION 61%
INTRAVENOUS | Status: AC
  Administered 2017-03-22: 100 mL via INTRAVENOUS
  Filled 2017-03-22: qty 100

## 2017-03-22 MED ORDER — PANTOPRAZOLE SODIUM 20 MG PO TBEC: 20.0000 mg | DELAYED_RELEASE_TABLET | Freq: Every day | ORAL | 0 refills | Status: DC

## 2017-03-22 MED ORDER — GI COCKTAIL ~~LOC~~
30.0000 mL | Freq: Once | ORAL | Status: AC
  Administered 2017-03-22: 30 mL via ORAL
  Filled 2017-03-22: qty 30

## 2017-03-22 MED ORDER — SODIUM CHLORIDE 0.9 % IV BOLUS (SEPSIS)
1000.0000 mL | Freq: Once | INTRAVENOUS | Status: AC
  Administered 2017-03-22: 1000 mL via INTRAVENOUS

## 2017-03-22 MED ORDER — ACETAMINOPHEN 500 MG PO TABS: 1000.0000 mg | ORAL_TABLET | Freq: Four times a day (QID) | ORAL | 0 refills | Status: DC | PRN

## 2017-03-22 NOTE — ED Notes (Signed)
Patient transported to X-ray 

## 2017-03-22 NOTE — ED Triage Notes (Signed)
Son/translator stated, shes had stomach pain and bloated for 5 years seems worse in the last month

## 2017-03-22 NOTE — Discharge Instructions (Signed)
Try to eat foods that are high in potassium at least once daily

## 2017-03-22 NOTE — ED Provider Notes (Signed)
[facility] EMERGENCY DEPARTMENT Provider Note   CSN: [id number] Arrival date & time: 03/22/17  0908     History   Chief Complaint  Chief Complaint  Patient presents with  . Abdominal Pain    HPI Amber Fisher is a 84 y.o. female.  HPI   84 year old female with past medical history of anemia, DVT, hip replacement, who presents with intermittent abdominal pain and belching.  History provided with the assistance of son, as patient speaks [place].  Per report, the patient reports several months to years of intermittent burning epigastric pain.  She feels like after she eats, she feels a burning sensation and frequent belching.  She then has gas-like cramps and flatulence.  She states that the symptoms seem to come and go but have been progressively worsened.  They seem to worsen with eating.  She does have some occasional cramp-like pain that is worse with palpation.  She has not had any fevers.  She has not had any chills.  Of note, she also complains of left shoulder pain chronically.  No recent falls.  The pain is worse with movement.  It is aching and throbbing.  No numbness or weakness of the extremity.  No neck pain.  No chest pain.  Past Medical History:  Diagnosis Date  . Acute blood loss anemia   . 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 Cough   . DVT (deep venous thrombosis) (HCC)   . Heart murmur   . Leukocytosis   . Painful orthopaedic hardware (HCC)   . Pressure ulcer     Patient Active Problem List   Diagnosis Date Noted  . DVT (deep venous thrombosis) (HCC) 04/30/2015  . Acute blood loss anemia 04/29/2015  . 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 Pressure ulcer 04/28/2015  . Painful orthopaedic hardware (HCC) 04/28/2015    Past Surgical History:  Procedure Laterality Date  . HIP ARTHROPLASTY Left 04/28/2015   Procedure: ARTHROPLASTY BIPOLAR HIP (HEMIARTHROPLASTY  WITH HARDWARE REMOVAL, ;  Surgeon: Jodi Geralds, MD;   Location: [facility] OR;  Service: Orthopedics;  Laterality: Left;  . HIP SURGERY      OB History    No data available       Home Medications    Prior to Admission medications   Medication Sig Start Date End Date Taking? Authorizing Provider  acetaminophen (TYLENOL) 500 MG tablet Take 2 tablets (1,000 mg total) by mouth every 6 (six) hours as needed for moderate pain. 03/22/17   Shaune Pollack, MD  apixaban (ELIQUIS) 5 MG TABS tablet Take 5 mg by mouth 2 (two) times daily.    [provider]  baclofen (LIORESAL) 10 MG tablet Take 1 tablet (10 mg total) by mouth every 8 (eight) hours as needed for muscle spasms. 04/28/15   Marshia Ly, PA-C  bisacodyl (DULCOLAX) 5 MG EC tablet Take 1 tablet (5 mg total) by mouth daily as needed for moderate constipation. 05/01/15   Leatha Gilding, MD  ferrous sulfate 325 (65 FE) MG tablet Take 1 tablet (325 mg total) by mouth daily with breakfast. 05/01/15   Leatha Gilding, MD  HYDROcodone-acetaminophen (NORCO/VICODIN) 5-325 MG tablet Take 1-2 tablets by mouth every 6 (six) hours as needed for moderate pain. 04/28/15   Marshia Ly, PA-C  Multiple Vitamins-Minerals (DECUBI-VITE) CAPS Take 1 capsule by mouth daily.    [provider]  pantoprazole (PROTONIX) 20 MG tablet Take 1 tablet (20 mg total) by  mouth daily for 14 days. 03/22/17 04/05/17  Shaune Pollack, MD  polyethylene glycol (MIRALAX / Ethelene Hal) packet Take 17 g by mouth 2 (two) times daily.    [provider]  Protein (PROCEL) POWD Take 2 scoop by mouth 2 (two) times daily.    [provider]  sennosides-docusate sodium (SENOKOT-S) 8.6-50 MG tablet Take 2 tablets by mouth 2 (two) times daily.    [provider]  sucralfate (CARAFATE) 1 g tablet Take 1 tablet (1 g total) by mouth 4 (four) times daily -  with meals and at bedtime for 10 days. 03/22/17 04/01/17  Shaune Pollack, MD    Family History No family history on file.  Social History Social History    Tobacco Use  . Smoking status: Never Smoker  . Smokeless tobacco: Never Used  Substance Use Topics  . Alcohol use: No  . Drug use: No     Allergies   Patient has no known allergies.   Review of Systems Review of Systems  Constitutional: Positive for fatigue. Negative for chills and fever.  HENT: Negative for congestion, rhinorrhea and sore throat.   Eyes: Negative for visual disturbance.  Respiratory: Negative for cough, shortness of breath and wheezing.   Cardiovascular: Negative for chest pain and leg swelling.  Gastrointestinal: Positive for abdominal pain and nausea. Negative for diarrhea and vomiting.  Genitourinary: Negative for dysuria, flank pain, vaginal bleeding and vaginal discharge.  Musculoskeletal: Positive for arthralgias. Negative for neck pain.  Skin: Negative for rash.  Allergic/Immunologic: Negative for immunocompromised state.  Neurological: Negative for syncope and headaches.  Hematological: Does not bruise/bleed easily.  All other systems reviewed and are negative.    Physical Exam Updated Vital Signs BP (!) 165/61 (BP Location: Right Arm)   Pulse 62   Temp 98 F (36.7 C) (Oral)   Resp 16   Ht 4\' 5"  (1.346 m)   Wt 38.6 kg (85 lb)   SpO2 99%   BMI 21.28 kg/m   Physical Exam  Constitutional: She is oriented to person, place, and time. She appears well-developed and well-nourished. No distress.  HENT:  Head: Normocephalic and atraumatic.  Eyes: Conjunctivae are normal.  Neck: Neck supple.  Cardiovascular: Normal rate, regular rhythm and normal heart sounds. Exam reveals no friction rub.  No murmur heard. Pulmonary/Chest: Effort normal and breath sounds normal. No respiratory distress. She has no wheezes. She has no rales.  Abdominal: She exhibits no distension.  Mild epigastric tenderness.  No rebound or guarding.  No rashes on the abdomen.  Musculoskeletal: She exhibits no edema.  Bony arthritis noted in bilateral hands.  There is  moderate tenderness of the left shoulder diffusely.  No joint effusion or warmth.  Range of motion full and painless.  Neurological: She is alert and oriented to person, place, and time. She exhibits normal muscle tone.  Skin: Skin is warm. Capillary refill takes less than 2 seconds.  Psychiatric: She has a normal mood and affect.  Nursing note and vitals reviewed.    ED Treatments / Results  Labs (all labs ordered are listed, but only abnormal results are displayed) Labs Reviewed  COMPREHENSIVE METABOLIC PANEL - Abnormal; Notable for the following components:      Result Value   Potassium 3.1 (*)    Chloride 99 (*)    Glucose, Bld 110 (*)    Total Protein 8.8 (*)    ALT 10 (*)    GFR calc non Af Amer 60 (*)  All other components within normal limits  CBC - Abnormal; Notable for the following components:   WBC 14.8 (*)    Platelets 470 (*)    All other components within normal limits  URINALYSIS, ROUTINE W REFLEX MICROSCOPIC - Abnormal; Notable for the following components:   Specific Gravity, Urine 1.034 (*)    All other components within normal limits  LIPASE, BLOOD    EKG  EKG Interpretation  Date/Time:  Sunday March 22 2017 15:18:18 EST Ventricular Rate:  66 PR Interval:    QRS Duration: 98 QT Interval:  452 QTC Calculation: 474 R Axis:   57 Text Interpretation:  Sinus rhythm Anteroseptal infarct, old Old anteroseptal infarct noted compared to prior, but otherwise no significant change Confirmed by Shaune PollackIsaacs, Cheney Ewart [phone]([id number]) on 03/22/2017 5:17:15 PM       Radiology Ct Abdomen Pelvis W Contrast  Result Date: 03/22/2017 CLINICAL DATA:  Nausea, vomiting, abdominal discomfort, and bloating. EXAM: CT ABDOMEN AND PELVIS WITH CONTRAST TECHNIQUE: Multidetector CT imaging of the abdomen and pelvis was performed using the standard protocol following bolus administration of intravenous contrast. CONTRAST:  100mL ISOVUE-300 IOPAMIDOL (ISOVUE-300) INJECTION 61% COMPARISON:   Abdominal radiographs 01/05/2011. Pelvic radiograph 04/28/2015. FINDINGS: Lower chest: Respiratory motion artifact and mild atelectasis in the visualized lung bases. Hepatobiliary: The hepatic dome was incompletely imaged. No focal abnormality is identified in the included portion of the liver. The gallbladder is unremarkable. There is no biliary dilatation. Pancreas: Upper limits of normal pancreatic duct diameter in the head and proximal body without pancreatic mass, atrophy, or inflammatory change identified. Spleen: Small without focal abnormality. Adrenals/Urinary Tract: Unremarkable adrenal glands. No evidence of renal mass, calculi, or hydronephrosis. Stomach/Bowel: Small sliding hiatal hernia. Collapsed proximal stomach limiting assessment. No evidence of bowel obstruction or inflammation. The appendix is not identified, however no inflammatory changes are seen in the right lower quadrant. Vascular/Lymphatic: Abdominal aortic atherosclerosis without aneurysm. No enlarged lymph nodes. Reproductive: Uterus and bilateral adnexa are unremarkable. Other: No intraperitoneal free fluid. No abdominal wall mass or hernia. Musculoskeletal: Left hip arthroplasty with associated streak artifact, partially obscuring the pelvis. T12 and L1 inferior endplate compression fractures with mild-to-moderate vertebral body height loss, potentially chronic. IMPRESSION: 1. No acute abnormality identified in the abdomen or pelvis. 2. T12 and L1 compression fractures, technically age indeterminate though favored to be chronic. 3.  Aortic Atherosclerosis (ICD10-I70.0). Electronically Signed   By: Sebastian AcheAllen  Grady M.D.   On: 03/22/2017 14:35   Dg Shoulder Left  Result Date: 03/22/2017 CLINICAL DATA:  84 year old female with left shoulder pain. No known injury. EXAM: LEFT SHOULDER - 2+ VIEW COMPARISON:  None. FINDINGS: No acute fracture or malalignment. The humerus remains located. Degenerative osteoarthritis present at the glenohumeral  joint. Degenerative changes are also present at the greater tuberosity in the region the rotator cuff insertion. Decreased subacromial space secondary the subacromial spurring. No lytic or blastic osseous lesion. IMPRESSION: 1. No evidence of acute fracture or malalignment. 2. Multifocal degenerative changes involving the left shoulder including glenohumeral joint osteoarthritis, subacromial spurring with decreased subacromial space and degenerative changes at the rotator cuff insertion. Electronically Signed   By: Malachy MoanHeath  McCullough M.D.   On: 03/22/2017 12:14    Procedures Procedures (including critical care time)  Medications Ordered in ED Medications  sodium chloride 0.9 % bolus 1,000 mL (0 mLs Intravenous Stopped 03/22/17 1540)  gi cocktail (Maalox,Lidocaine,Donnatal) (30 mLs Oral Given 03/22/17 1224)  ondansetron (ZOFRAN) injection 4 mg (4 mg Intravenous Given 03/22/17 1225)  iopamidol (ISOVUE-300)  61 % injection (100 mLs Intravenous Contrast Given 03/22/17 1358)  potassium chloride SA (K-DUR,KLOR-CON) CR tablet 40 mEq (40 mEq Oral Given 03/22/17 1502)     Initial Impression / Assessment and Plan / ED Course  I have reviewed the triage vital signs and the nursing notes.  Pertinent labs & imaging results that were available during my care of the patient were reviewed by me and considered in my medical decision making (see chart for details).     84 year old female with past medical history as above here with intermittent burning epigastric pain and cramping.  Patient's history is highly consistent with likely reflux with possible IBS.  Her lab work is very reassuring.  She has a mild likely reactive leukocytosis and hypokalemia, which has been replaced.  CT abdomen and pelvis obtained and shows no acute abnormality.  She has age-indeterminate compression fractures but denies any back pain.  Her pain and symptoms are not consistent with mesenteric ischemia.  She feels markedly improved with GI  cocktail, consistent with likely gastritis.  Will treat with antacids and outpatient follow-up.  Regarding her shoulder pain, this is reproducible likely due to her significant arthritis.  Plain film shows no fracture but confirms significant arthritis.  She has no weakness, numbness, or symptoms to suggest cervical etiology or radiculopathy.  She has no evidence to suggest referred pain from cardiac or pulmonary source.  Will treat supportively.  This note was prepared with assistance of Conservation officer, historic buildings. Occasional wrong-word or sound-a-like substitutions may have occurred due to the inherent limitations of voice recognition software.   Final Clinical Impressions(s) / ED Diagnoses   Final diagnoses:  Acute superficial gastritis without hemorrhage  Arthritis    ED Discharge Orders        Ordered    pantoprazole (PROTONIX) 20 MG tablet  Daily     03/22/17 1553    sucralfate (CARAFATE) 1 g tablet  3 times daily with meals & bedtime     03/22/17 1553    acetaminophen (TYLENOL) 500 MG tablet  Every 6 hours PRN     03/22/17 1553       Shaune Pollack, MD 03/22/17 1724

## 2017-03-22 NOTE — ED Notes (Signed)
Pt ambulated to room from lobby, no acute distress, pt. Wanted to walk

## 2017-03-22 NOTE — ED Notes (Signed)
Pt c/o pain in "bones" in arms - more in left arm, son states he thinks it is arthritis,

## 2018-03-08 ENCOUNTER — Emergency Department (HOSPITAL_COMMUNITY)
Admission: EM | Admit: 2018-03-08 | Discharge: 2018-03-08 | Disposition: A | Discharge status: Home / Self Care | Payer: Medicare Other | Attending: Emergency Medicine | Admitting: Emergency Medicine

## 2018-03-08 ENCOUNTER — Encounter (HOSPITAL_COMMUNITY): Payer: Self-pay | Admitting: Internal Medicine

## 2018-03-08 ENCOUNTER — Emergency Department (HOSPITAL_COMMUNITY): Payer: Medicare Other

## 2018-03-08 DIAGNOSIS — Z96642 Presence of left artificial hip joint: Secondary | ICD-10-CM | POA: Diagnosis not present

## 2018-03-08 DIAGNOSIS — Z79899 Other long term (current) drug therapy: Secondary | ICD-10-CM | POA: Diagnosis not present

## 2018-03-08 DIAGNOSIS — G8929 Other chronic pain: Secondary | ICD-10-CM | POA: Insufficient documentation

## 2018-03-08 DIAGNOSIS — E876 Hypokalemia: Secondary | ICD-10-CM | POA: Diagnosis not present

## 2018-03-08 DIAGNOSIS — R1084 Generalized abdominal pain: Secondary | ICD-10-CM | POA: Insufficient documentation

## 2018-03-08 DIAGNOSIS — H6123 Impacted cerumen, bilateral: Secondary | ICD-10-CM | POA: Insufficient documentation

## 2018-03-08 DIAGNOSIS — Z7901 Long term (current) use of anticoagulants: Secondary | ICD-10-CM | POA: Insufficient documentation

## 2018-03-08 DIAGNOSIS — R109 Unspecified abdominal pain: Secondary | ICD-10-CM

## 2018-03-08 LAB — CBC WITH DIFFERENTIAL/PLATELET
Abs Immature Granulocytes: 0.05 10*3/uL (ref 0.00–0.07)
BASOS PCT: 1 %
Basophils Absolute: 0.1 10*3/uL (ref 0.0–0.1)
EOS PCT: 4 %
Eosinophils Absolute: 0.3 10*3/uL (ref 0.0–0.5)
HCT: 35.9 % — ABNORMAL LOW (ref 36.0–46.0)
Hemoglobin: 11.7 g/dL — ABNORMAL LOW (ref 12.0–15.0)
Immature Granulocytes: 1 %
Lymphocytes Relative: 28 %
Lymphs Abs: 2.7 10*3/uL (ref 0.7–4.0)
MCH: 29.6 pg (ref 26.0–34.0)
MCHC: 32.6 g/dL (ref 30.0–36.0)
MCV: 90.9 fL (ref 80.0–100.0)
MONO ABS: 0.7 10*3/uL (ref 0.1–1.0)
Monocytes Relative: 7 %
Neutro Abs: 5.9 10*3/uL (ref 1.7–7.7)
Neutrophils Relative %: 59 %
Platelets: 394 10*3/uL (ref 150–400)
RBC: 3.95 MIL/uL (ref 3.87–5.11)
RDW: 14 % (ref 11.5–15.5)
WBC: 9.7 10*3/uL (ref 4.0–10.5)
nRBC: 0 % (ref 0.0–0.2)

## 2018-03-08 LAB — COMPREHENSIVE METABOLIC PANEL
ALT: 12 U/L (ref 0–44)
AST: 28 U/L (ref 15–41)
Albumin: 3.3 g/dL — ABNORMAL LOW (ref 3.5–5.0)
Alkaline Phosphatase: 77 U/L (ref 38–126)
Anion gap: 9 (ref 5–15)
BUN: 19 mg/dL (ref 8–23)
CO2: 26 mmol/L (ref 22–32)
CREATININE: 0.91 mg/dL (ref 0.44–1.00)
Calcium: 9 mg/dL (ref 8.9–10.3)
Chloride: 100 mmol/L (ref 98–111)
GFR, EST NON AFRICAN AMERICAN: 59 mL/min — AB (ref >60)
Glucose, Bld: 99 mg/dL (ref 70–99)
Potassium: 3.3 mmol/L — ABNORMAL LOW (ref 3.5–5.1)
Sodium: 135 mmol/L (ref 135–145)
TOTAL PROTEIN: 7.6 g/dL (ref 6.5–8.1)
Total Bilirubin: 1.1 mg/dL (ref 0.3–1.2)

## 2018-03-08 LAB — POTASSIUM: Potassium: 3.3 mmol/L — ABNORMAL LOW (ref 3.5–5.1)

## 2018-03-08 LAB — LIPASE, BLOOD: Lipase: 24 U/L (ref 11–51)

## 2018-03-08 LAB — MAGNESIUM: Magnesium: 2.1 mg/dL (ref 1.7–2.4)

## 2018-03-08 MED ORDER — IOHEXOL 300 MG/ML  SOLN
75.0000 mL | Freq: Once | INTRAMUSCULAR | Status: AC | PRN
  Administered 2018-03-08: 75 mL via INTRAVENOUS

## 2018-03-08 MED ORDER — POTASSIUM CHLORIDE CRYS ER 20 MEQ PO TBCR: 20.0000 meq | EXTENDED_RELEASE_TABLET | Freq: Every day | ORAL | 0 refills | Status: DC

## 2018-03-08 MED ORDER — SODIUM CHLORIDE 0.9 % IV BOLUS
1000.0000 mL | Freq: Once | INTRAVENOUS | Status: AC
  Administered 2018-03-08: 1000 mL via INTRAVENOUS

## 2018-03-08 MED ORDER — FAMOTIDINE IN NACL 20-0.9 MG/50ML-% IV SOLN
20.0000 mg | Freq: Once | INTRAVENOUS | Status: AC
  Administered 2018-03-08: 20 mg via INTRAVENOUS
  Filled 2018-03-08: qty 50

## 2018-03-08 MED ORDER — LIDOCAINE VISCOUS HCL 2 % MT SOLN
15.0000 mL | Freq: Once | OROMUCOSAL | Status: AC
  Administered 2018-03-08: 15 mL via ORAL
  Filled 2018-03-08: qty 15

## 2018-03-08 MED ORDER — DOCUSATE SODIUM 50 MG/5ML PO LIQD
100.0000 mg | Freq: Once | ORAL | Status: AC
Start: 2018-03-08 — End: 2018-03-08
  Administered 2018-03-08: 100 mg via ORAL
  Filled 2018-03-08: qty 10

## 2018-03-08 MED ORDER — FAMOTIDINE 20 MG PO TABS: 20.0000 mg | ORAL_TABLET | Freq: Two times a day (BID) | ORAL | 0 refills | Status: DC

## 2018-03-08 MED ORDER — ALUM & MAG HYDROXIDE-SIMETH 200-200-20 MG/5ML PO SUSP
30.0000 mL | Freq: Once | ORAL | Status: AC
  Administered 2018-03-08: 30 mL via ORAL
  Filled 2018-03-08: qty 30

## 2018-03-08 MED ORDER — SUCRALFATE 1 G PO TABS: 1.0000 g | ORAL_TABLET | Freq: Three times a day (TID) | ORAL | 0 refills | Status: DC

## 2018-03-08 MED ORDER — POTASSIUM CHLORIDE CRYS ER 20 MEQ PO TBCR
40.0000 meq | EXTENDED_RELEASE_TABLET | Freq: Once | ORAL | Status: AC
  Administered 2018-03-08: 40 meq via ORAL
  Filled 2018-03-08: qty 2

## 2018-03-08 MED ORDER — PANTOPRAZOLE SODIUM 40 MG PO TBEC: 40.0000 mg | DELAYED_RELEASE_TABLET | Freq: Every day | ORAL | 1 refills | Status: DC

## 2018-03-08 NOTE — Discharge Instructions (Signed)
If you develop worsening abdominal pain, vomiting or vomiting blood, severe chest pain, or any other new/concerning symptoms then return to the ER for evaluation.  Otherwise follow-up with your primary care doctor and a gastroenterologist.

## 2018-03-08 NOTE — ED Provider Notes (Signed)
[facility] EMERGENCY DEPARTMENT Provider Note   CSN: [id number] Arrival date & time: 03/08/18  4098     History   Chief Complaint Chief Complaint  Patient presents with  . Abdominal Pain    HPI Amber Fisher is a 84 y.o. female.  HPI  84 year old female presents with a chief complaint of abdominal pain.  The patient speaks [place] and so her son at the bedside translates.  She is been having abdominal pain for a long time and has been getting worse over 6 months.  It is mostly right upper quadrant to left upper quadrant though occasionally radiates upper chest and feels like a burning.  She does not vomit but sometimes feels like she needs to.  The patient seemed to be crying a lot due to pain last night which is why the son brought her in today.  She has tried antacids in the past but is not currently on them.  The pain often feels like a warmth and burning in her abdomen.  She is also been having chronic chest pain during this time, unclear if this is from the radiation or separate pain.  She has not had any blood in her stool.  The patient also has a separate complaint of ear pain mentioned by the son.  She is been having bilateral ear pain with abnormal noises in her ear.  This is been for at least 1 month but probably longer according to the son.  Past Medical History:  Diagnosis Date  . Acute blood loss anemia   . 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 Cough   . DVT (deep venous thrombosis) (HCC)   . Heart murmur   . Leukocytosis   . Painful orthopaedic hardware (HCC)   . Pressure ulcer     Patient Active Problem List   Diagnosis Date Noted  . DVT (deep venous thrombosis) (HCC) 04/30/2015  . Acute blood loss anemia 04/29/2015  . 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 Pressure ulcer 04/28/2015  . Painful orthopaedic hardware (HCC) 04/28/2015    Past Surgical History:  Procedure Laterality Date  . HIP ARTHROPLASTY Left  04/28/2015   Procedure: ARTHROPLASTY BIPOLAR HIP (HEMIARTHROPLASTY  WITH HARDWARE REMOVAL, ;  Surgeon: Jodi Geralds, MD;  Location: [facility] OR;  Service: Orthopedics;  Laterality: Left;  . HIP SURGERY       OB History   No obstetric history on file.      Home Medications    Prior to Admission medications   Medication Sig Start Date End Date Taking? Authorizing Provider  acetaminophen (TYLENOL) 500 MG tablet Take 2 tablets (1,000 mg total) by mouth every 6 (six) hours as needed for moderate pain. 03/22/17   Shaune Pollack, MD  apixaban (ELIQUIS) 5 MG TABS tablet Take 5 mg by mouth 2 (two) times daily.    [provider]  baclofen (LIORESAL) 10 MG tablet Take 1 tablet (10 mg total) by mouth every 8 (eight) hours as needed for muscle spasms. 04/28/15   Marshia Ly, PA-C  bisacodyl (DULCOLAX) 5 MG EC tablet Take 1 tablet (5 mg total) by mouth daily as needed for moderate constipation. 05/01/15   Leatha Gilding, MD  famotidine (PEPCID) 20 MG tablet Take 1 tablet (20 mg total) by mouth 2 (two) times daily. 03/08/18   Pricilla Loveless, MD  ferrous sulfate 325 (65 FE) MG tablet Take 1 tablet (325 mg total) by mouth daily with breakfast. 05/01/15  Leatha Gilding, MD  HYDROcodone-acetaminophen (NORCO/VICODIN) 5-325 MG tablet Take 1-2 tablets by mouth every 6 (six) hours as needed for moderate pain. 04/28/15   Marshia Ly, PA-C  Multiple Vitamins-Minerals (DECUBI-VITE) CAPS Take 1 capsule by mouth daily.    [provider]  pantoprazole (PROTONIX) 40 MG tablet Take 1 tablet (40 mg total) by mouth daily. 03/08/18   Pricilla Loveless, MD  polyethylene glycol (MIRALAX / Ethelene Hal) packet Take 17 g by mouth 2 (two) times daily.    [provider]  potassium chloride SA (K-DUR,KLOR-CON) 20 MEQ tablet Take 1 tablet (20 mEq total) by mouth daily. 03/08/18   Pricilla Loveless, MD  Protein (PROCEL) POWD Take 2 scoop by mouth 2 (two) times daily.    [provider]    sennosides-docusate sodium (SENOKOT-S) 8.6-50 MG tablet Take 2 tablets by mouth 2 (two) times daily.    [provider]  sucralfate (CARAFATE) 1 g tablet Take 1 tablet (1 g total) by mouth 4 (four) times daily -  with meals and at bedtime for 10 days. 03/08/18 03/18/18  Pricilla Loveless, MD    Family History No family history on file.  Social History Social History   Tobacco Use  . Smoking status: Never Smoker  . Smokeless tobacco: Never Used  Substance Use Topics  . Alcohol use: No  . Drug use: No     Allergies   Patient has no known allergies.   Review of Systems Review of Systems  Constitutional: Negative for fever.  HENT: Positive for ear pain.   Cardiovascular: Positive for chest pain.  Gastrointestinal: Positive for abdominal pain. Negative for blood in stool and vomiting.  All other systems reviewed and are negative.    Physical Exam Updated Vital Signs BP (!) 183/56   Pulse 61   Temp 97.8 F (36.6 C) (Oral)   Resp 13   SpO2 100%   Physical Exam Vitals signs and nursing note reviewed.  Constitutional:      General: She is not in acute distress.    Appearance: She is well-developed. She is not ill-appearing, toxic-appearing or diaphoretic.  HENT:     Head: Normocephalic and atraumatic.     Right Ear: External ear normal.     Left Ear: External ear normal.     Ears:     Comments: Bilateral Cerumen Impactions    Nose: Nose normal.  Eyes:     General:        Right eye: No discharge.        Left eye: No discharge.  Cardiovascular:     Rate and Rhythm: Normal rate and regular rhythm.     Heart sounds: Normal heart sounds.  Pulmonary:     Effort: Pulmonary effort is normal.     Breath sounds: Normal breath sounds.  Abdominal:     Palpations: Abdomen is soft.     Tenderness: There is generalized abdominal tenderness (mild tenderness diffusely. mostly points to LUQ but endorses discomfort everywhere).  Skin:    General: Skin is warm and dry.   Neurological:     Mental Status: She is alert.  Psychiatric:        Mood and Affect: Mood is not anxious.      ED Treatments / Results  Labs (all labs ordered are listed, but only abnormal results are displayed) Labs Reviewed  COMPREHENSIVE METABOLIC PANEL - Abnormal; Notable for the following components:      Result Value   Potassium 3.3 (*)  Albumin 3.3 (*)    GFR calc non Af Amer 59 (*)    All other components within normal limits  CBC WITH DIFFERENTIAL/PLATELET - Abnormal; Notable for the following components:   Hemoglobin 11.7 (*)    HCT 35.9 (*)    All other components within normal limits  POTASSIUM - Abnormal; Notable for the following components:   Potassium 3.3 (*)    All other components within normal limits  LIPASE, BLOOD  MAGNESIUM    EKG EKG Interpretation  Date/Time:  Monday March 08 2018 08:10:26 EST Ventricular Rate:  64 PR Interval:    QRS Duration: 103 QT Interval:  445 QTC Calculation: 460 R Axis:   53 Text Interpretation:  Sinus rhythm Abnormal R-wave progression, early transition Baseline wander in lead(s) V5 no significant change since Jan 2019 Confirmed by Pricilla LovelessGoldston, Che Rachal [phone]([id number]) on 03/08/2018 8:13:20 AM   Radiology Ct Abdomen Pelvis W Contrast  Result Date: 03/08/2018 CLINICAL DATA:  Midline abdominal pain. EXAM: CT ABDOMEN AND PELVIS WITH CONTRAST TECHNIQUE: Multidetector CT imaging of the abdomen and pelvis was performed using the standard protocol following bolus administration of intravenous contrast. CONTRAST:  75mL OMNIPAQUE IOHEXOL 300 MG/ML  SOLN COMPARISON:  03/22/2017 FINDINGS: Lower chest: Small hiatal hernia. Dependent atelectasis in the lower lungs. No effusions. Hepatobiliary: No focal hepatic abnormality. Gallbladder unremarkable. Pancreas: No focal abnormality or ductal dilatation. Spleen: No focal abnormality.  Normal size. Adrenals/Urinary Tract: No adrenal abnormality. No focal renal abnormality. No stones or  hydronephrosis. Urinary bladder is unremarkable. Stomach/Bowel: Normal appendix. Stomach, large and small bowel grossly unremarkable. Vascular/Lymphatic: Aortic atherosclerosis. No enlarged abdominal or pelvic lymph nodes. Reproductive: Uterus and adnexa unremarkable.  No mass. Other: No free fluid or free air. Musculoskeletal: Left hip replacement. Mild compression deformities in the lumbar spine, stable at T12 and L1, new at the inferior endplate of L4. IMPRESSION: Small hiatal hernia. No acute findings in the abdomen or pelvis. Aortic atherosclerosis. Mild compression deformities in the lower thoracic and lumbar spine as above. Electronically Signed   By: Charlett NoseKevin  Dover M.D.   On: 03/08/2018 10:22    Procedures Procedures (including critical care time)  Medications Ordered in ED Medications  docusate (COLACE) 50 MG/5ML liquid 100 mg (100 mg Oral Given 03/08/18 0904)  sodium chloride 0.9 % bolus 1,000 mL (0 mLs Intravenous Stopped 03/08/18 1010)  alum & mag hydroxide-simeth (MAALOX/MYLANTA) 200-200-20 MG/5ML suspension 30 mL (30 mLs Oral Given 03/08/18 0846)    And  lidocaine (XYLOCAINE) 2 % viscous mouth solution 15 mL (15 mLs Oral Given 03/08/18 0845)  famotidine (PEPCID) IVPB 20 mg premix (0 mg Intravenous Stopped 03/08/18 0937)  potassium chloride SA (K-DUR,KLOR-CON) CR tablet 40 mEq (40 mEq Oral Given 03/08/18 1149)  iohexol (OMNIPAQUE) 300 MG/ML solution 75 mL (75 mLs Intravenous Contrast Given 03/08/18 1007)     Initial Impression / Assessment and Plan / ED Course  I have reviewed the triage vital signs and the nursing notes.  Pertinent labs & imaging results that were available during my care of the patient were reviewed by me and considered in my medical decision making (see chart for details).     Patient has chronic abdominal pain. History sounds like GERD/gastritis. Given age and acute on chronic worsening, CT obtained. No obvious acute abnormalities or perforation. Will replete  K. Doubt this is cardiac given chronicity and association with food. Ear problems likely from cerumen impactions bilaterally, also chronic. These were cleaned and irrigated by nursing staff. Discussed results and need  for PCP/GI follow up through the son. D/c home with return precautions.  Final Clinical Impressions(s) / ED Diagnoses   Final diagnoses:  Chronic abdominal pain  Bilateral impacted cerumen  Hypokalemia    ED Discharge Orders         Ordered    potassium chloride SA (K-DUR,KLOR-CON) 20 MEQ tablet  Daily     03/08/18 1154    pantoprazole (PROTONIX) 40 MG tablet  Daily     03/08/18 1154    famotidine (PEPCID) 20 MG tablet  2 times daily     03/08/18 1154    sucralfate (CARAFATE) 1 g tablet  3 times daily with meals & bedtime     03/08/18 1154           Pricilla LovelessGoldston, Alexandrina Fiorini, MD 03/09/18 [phone]

## 2018-03-08 NOTE — ED Notes (Signed)
Patient transported to CT 

## 2018-03-08 NOTE — ED Notes (Signed)
ED Provider at bedside. 

## 2018-03-08 NOTE — ED Notes (Signed)
Patient verbalizes understanding of discharge instructions. Opportunity for questioning and answers were provided. Armband removed by staff, pt discharged from ED.  

## 2018-03-08 NOTE — ED Notes (Signed)
Patient ambulatory to bathroom with steady gait at this time 

## 2018-03-08 NOTE — ED Notes (Addendum)
Colace placed in patient's right ear. Pt instructed to keep head tilted to left side to allow colace to break up cerumen. Pt tolerating well.

## 2018-03-08 NOTE — ED Triage Notes (Signed)
Pt here c/o worsening abdominal pain over the last 6 months. Son states that she has had decreased appetite and pain increases after eating. Denies vomiting and blood in stool. Also c/o ear pain x1 month. VSS at this time. Primary language [place].

## 2018-04-16 ENCOUNTER — Other Ambulatory Visit: Payer: Self-pay

## 2018-04-16 ENCOUNTER — Encounter (HOSPITAL_COMMUNITY): Payer: Self-pay | Admitting: Emergency Medicine

## 2018-04-16 ENCOUNTER — Emergency Department (HOSPITAL_COMMUNITY)
Admission: EM | Admit: 2018-04-16 | Discharge: 2018-04-16 | Disposition: A | Discharge status: Home / Self Care | Payer: Medicare Other | Attending: Emergency Medicine | Admitting: Emergency Medicine

## 2018-04-16 ENCOUNTER — Emergency Department (HOSPITAL_COMMUNITY): Payer: Medicare Other

## 2018-04-16 DIAGNOSIS — S40011A Contusion of right shoulder, initial encounter: Secondary | ICD-10-CM | POA: Insufficient documentation

## 2018-04-16 DIAGNOSIS — W19XXXA Unspecified fall, initial encounter: Secondary | ICD-10-CM

## 2018-04-16 DIAGNOSIS — W109XXA Fall (on) (from) unspecified stairs and steps, initial encounter: Secondary | ICD-10-CM | POA: Diagnosis not present

## 2018-04-16 DIAGNOSIS — S7001XA Contusion of right hip, initial encounter: Secondary | ICD-10-CM | POA: Insufficient documentation

## 2018-04-16 DIAGNOSIS — Y999 Unspecified external cause status: Secondary | ICD-10-CM | POA: Insufficient documentation

## 2018-04-16 DIAGNOSIS — Z79899 Other long term (current) drug therapy: Secondary | ICD-10-CM | POA: Diagnosis not present

## 2018-04-16 DIAGNOSIS — S161XXA Strain of muscle, fascia and tendon at neck level, initial encounter: Secondary | ICD-10-CM

## 2018-04-16 DIAGNOSIS — Y929 Unspecified place or not applicable: Secondary | ICD-10-CM | POA: Insufficient documentation

## 2018-04-16 DIAGNOSIS — Y939 Activity, unspecified: Secondary | ICD-10-CM | POA: Insufficient documentation

## 2018-04-16 DIAGNOSIS — S0990XA Unspecified injury of head, initial encounter: Secondary | ICD-10-CM

## 2018-04-16 DIAGNOSIS — S39012A Strain of muscle, fascia and tendon of lower back, initial encounter: Secondary | ICD-10-CM

## 2018-04-16 DIAGNOSIS — S29012A Strain of muscle and tendon of back wall of thorax, initial encounter: Secondary | ICD-10-CM | POA: Diagnosis not present

## 2018-04-16 LAB — URINALYSIS, ROUTINE W REFLEX MICROSCOPIC
BILIRUBIN URINE: NEGATIVE
Glucose, UA: NEGATIVE mg/dL
Ketones, ur: NEGATIVE mg/dL
Leukocytes, UA: NEGATIVE
Nitrite: NEGATIVE
Protein, ur: NEGATIVE mg/dL
SPECIFIC GRAVITY, URINE: 1.009 (ref 1.005–1.030)
pH: 5 (ref 5.0–8.0)

## 2018-04-16 MED ORDER — FAMOTIDINE 20 MG PO TABS: 20.0000 mg | ORAL_TABLET | Freq: Two times a day (BID) | ORAL | 0 refills | Status: DC

## 2018-04-16 MED ORDER — ACETAMINOPHEN 500 MG PO TABS
1000.0000 mg | ORAL_TABLET | Freq: Once | ORAL | Status: AC
  Administered 2018-04-16: 1000 mg via ORAL
  Filled 2018-04-16: qty 2

## 2018-04-16 NOTE — ED Provider Notes (Signed)
[facility] EMERGENCY DEPARTMENT Provider Note   CSN: [id number] Arrival date & time: 04/16/18  1623     History   Chief Complaint Chief Complaint  Patient presents with  . Fall    HPI Katharina Jeannine Kitten is a 85 y.o. female.  Patient is a 85 year old female who presents after a fall.  She is from [place] and her son interprets.  She was chasing a toddler and slipped down about 2 or 3 stairs landing backwards.  She has pain to her lower back and on her right side.  She had no loss of consciousness.  It is reported on her med list that she takes Eliquis but her son does not know if she still takes this.  She has pain to her right shoulder and right elbow and right wrist.  She has a bruise behind her right hip.  She denies any numbness or weakness to her extremities.  She also requests [place] to check her urine as she has had some recurrent issues with urinary tract infections.  She denies any recent fevers.  She has some chronic abdominal pain which is unchanged from her baseline.     Past Medical History:  Diagnosis Date  . Acute blood loss anemia   . 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 Cough   . DVT (deep venous thrombosis) (HCC)   . Heart murmur   . Leukocytosis   . Painful orthopaedic hardware (HCC)   . Pressure ulcer     Patient Active Problem List   Diagnosis Date Noted  . DVT (deep venous thrombosis) (HCC) 04/30/2015  . Acute blood loss anemia 04/29/2015  . 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 Pressure ulcer 04/28/2015  . Painful orthopaedic hardware (HCC) 04/28/2015    Past Surgical History:  Procedure Laterality Date  . HIP ARTHROPLASTY Left 04/28/2015   Procedure: ARTHROPLASTY BIPOLAR HIP (HEMIARTHROPLASTY  WITH HARDWARE REMOVAL, ;  Surgeon: Jodi Geralds, MD;  Location: [facility] OR;  Service: Orthopedics;  Laterality: Left;  . HIP SURGERY       OB History   No obstetric history on file.      Home Medications    Prior to  Admission medications   Medication Sig Start Date End Date Taking? Authorizing Provider  acetaminophen (TYLENOL) 500 MG tablet Take 2 tablets (1,000 mg total) by mouth every 6 (six) hours as needed for moderate pain. 03/22/17   Shaune Pollack, MD  apixaban (ELIQUIS) 5 MG TABS tablet Take 5 mg by mouth 2 (two) times daily.    [provider]  baclofen (LIORESAL) 10 MG tablet Take 1 tablet (10 mg total) by mouth every 8 (eight) hours as needed for muscle spasms. 04/28/15   Marshia Ly, PA-C  bisacodyl (DULCOLAX) 5 MG EC tablet Take 1 tablet (5 mg total) by mouth daily as needed for moderate constipation. 05/01/15   Leatha Gilding, MD  famotidine (PEPCID) 20 MG tablet Take 1 tablet (20 mg total) by mouth 2 (two) times daily. 04/16/18   Rolan Bucco, MD  ferrous sulfate 325 (65 FE) MG tablet Take 1 tablet (325 mg total) by mouth daily with breakfast. 05/01/15   Leatha Gilding, MD  HYDROcodone-acetaminophen (NORCO/VICODIN) 5-325 MG tablet Take 1-2 tablets by mouth every 6 (six) hours as needed for moderate pain. 04/28/15   Marshia Ly, PA-C  Multiple Vitamins-Minerals (DECUBI-VITE) CAPS Take 1 capsule by mouth daily.    [provider]  pantoprazole (PROTONIX)  40 MG tablet Take 1 tablet (40 mg total) by mouth daily. 03/08/18   Pricilla LovelessGoldston, Scott, MD  polyethylene glycol (MIRALAX / Ethelene HalGLYCOLAX) packet Take 17 g by mouth 2 (two) times daily.    [provider]  potassium chloride SA (K-DUR,KLOR-CON) 20 MEQ tablet Take 1 tablet (20 mEq total) by mouth daily. 03/08/18   Pricilla LovelessGoldston, Scott, MD  Protein (PROCEL) POWD Take 2 scoop by mouth 2 (two) times daily.    [provider]  sennosides-docusate sodium (SENOKOT-S) 8.6-50 MG tablet Take 2 tablets by mouth 2 (two) times daily.    [provider]  sucralfate (CARAFATE) 1 g tablet Take 1 tablet (1 g total) by mouth 4 (four) times daily -  with meals and at bedtime for 10 days. 03/08/18 03/18/18  Pricilla LovelessGoldston, Scott, MD     Family History No family history on file.  Social History Social History   Tobacco Use  . Smoking status: Never Smoker  . Smokeless tobacco: Never Used  Substance Use Topics  . Alcohol use: No  . Drug use: No     Allergies   Patient has no known allergies.   Review of Systems Review of Systems  Constitutional: Negative for chills, diaphoresis, fatigue and fever.  HENT: Negative for congestion, rhinorrhea and sneezing.   Eyes: Negative.   Respiratory: Negative for cough, chest tightness and shortness of breath.   Cardiovascular: Negative for chest pain and leg swelling.  Gastrointestinal: Negative for abdominal pain, blood in stool, diarrhea, nausea and vomiting.  Genitourinary: Negative for difficulty urinating, flank pain, frequency and hematuria.  Musculoskeletal: Positive for arthralgias, back pain and neck pain.  Skin: Negative for rash.  Neurological: Negative for dizziness, speech difficulty, weakness, numbness and headaches.     Physical Exam Updated Vital Signs BP (!) 175/75 (BP Location: Right Arm)   Pulse (!) 56   Temp 98 F (36.7 C) (Oral)   Resp 17   SpO2 100%   Physical Exam Constitutional:      Appearance: She is well-developed.  HENT:     Head: Normocephalic and atraumatic.  Eyes:     Pupils: Pupils are equal, round, and reactive to light.  Neck:     Comments: Positive tenderness in the mid and lower cervical spine and throughout the thoracic spine as well as the lumbosacral spine.  No step-offs or deformities. Cardiovascular:     Rate and Rhythm: Normal rate and regular rhythm.     Heart sounds: Normal heart sounds.  Pulmonary:     Effort: Pulmonary effort is normal. No respiratory distress.     Breath sounds: Normal breath sounds. No wheezing or rales.  Chest:     Chest wall: No tenderness.  Abdominal:     General: Bowel sounds are normal.     Palpations: Abdomen is soft.     Tenderness: There is no abdominal tenderness. There is  no guarding or rebound.  Musculoskeletal: Normal range of motion.     Comments: Positive tenderness on palpation of the right shoulder, right elbow and right wrist.  There is mild area of ecchymosis to the right elbow without deformity.  There is also a contused area to the posterior aspect of the right hip.  There is some mild pain on range of motion of the hip but she is able to stand and bear weight on both legs.  There is some mild pain on palpation of the right knee but no other pain on palpation or range of motion of  the extremities.  Lymphadenopathy:     Cervical: No cervical adenopathy.  Skin:    General: Skin is warm and dry.     Findings: No rash.  Neurological:     Mental Status: She is alert and oriented to person, place, and time.      ED Treatments / Results  Labs (all labs ordered are listed, but only abnormal results are displayed) Labs Reviewed  URINALYSIS, ROUTINE W REFLEX MICROSCOPIC - Abnormal; Notable for the following components:      Result Value   Color, Urine STRAW (*)    Hgb urine dipstick SMALL (*)    Bacteria, UA RARE (*)    All other components within normal limits    EKG None  Radiology Dg Thoracic Spine 2 View  Result Date: 04/16/2018 CLINICAL DATA:  Pt's son st's pt fell earlier today. Pt c/o pain in right hip, right elbow and back. Pt was ambulatory after the fall. Pt walks with a cane. Son also st's he thinks she may have a UTI . Pt's son also st's pt has multiple complaints and wants everything checked. Hx left hip replacement. Unable to obtain further history. EXAM: THORACIC SPINE 2 VIEWS COMPARISON:  Abdomen and pelvis CT, 03/08/2018. FINDINGS: Mild compression fracture of T12 with moderate compression fracture of L1. These are stable from the CT dated 03/08/2018. No other fractures. No bone lesions. No spondylolisthesis. Skeletal structures are demineralized. Surrounding soft tissues are unremarkable. IMPRESSION: 1. Chronic compression fractures  of T12, L1. No acute fracture or acute finding. Electronically Signed   By: Amie Portlandavid  Ormond M.D.   On: 04/16/2018 20:02   Dg Lumbar Spine Complete  Result Date: 04/16/2018 CLINICAL DATA:  Pt's son st's pt fell earlier today. Pt c/o pain in right hip, right elbow and back. Pt was ambulatory after the fall. Pt walks with a cane. Son also st's he thinks she may have a UTI . Pt's son also st's pt has multiple complaints and wants everything checked. Hx left hip replacement. Unable to obtain further history. EXAM: LUMBAR SPINE - COMPLETE 4+ VIEW COMPARISON:  CT, 03/08/2018 FINDINGS: There are stable compression fractures of T12 and L1. Mild compression fracture of L4, also unchanged from the prior CT. No acute fracture. No spondylolisthesis. No bone lesions. Skeletal structures are demineralized. There are scattered aortic atherosclerotic calcifications. IMPRESSION: 1. No acute findings. 2. Chronic compression fractures of T12, L1 and L4. Electronically Signed   By: Amie Portlandavid  Ormond M.D.   On: 04/16/2018 20:07   Dg Shoulder Right  Result Date: 04/16/2018 CLINICAL DATA:  Pt's son st's pt fell earlier today. Pt c/o pain in right hip, right elbow and back. Pt was ambulatory after the fall. Pt walks with a cane. Son also st's he thinks she may have a UTI . Pt's son also st's pt has multiple complaints and wants everything checked. Hx left hip replacement. Unable to obtain further history. EXAM: RIGHT SHOULDER - 2+ VIEW COMPARISON:  None. FINDINGS: No fracture or dislocation. Marked narrowing of the glenohumeral joint space with mild subchondral sclerosis along the upper glenoid. Subacromial spur. Mild narrowing of the subacromial space to 5 mm. AC joint normally aligned. Bones are demineralized. Soft tissues are unremarkable. IMPRESSION: 1. No fracture or dislocation. 2. Glenohumeral joint arthropathic changes with marked joint space narrowing. 3. Mild narrowing of the subacromial space and subacromial spur. This raises  the possibility of a chronic rotator cuff tear. Electronically Signed   By: Renard Hamperavid  Ormond M.D.  On: 04/16/2018 20:06   Dg Elbow Complete Right  Result Date: 04/16/2018 CLINICAL DATA:  Initial evaluation for acute trauma, fall. EXAM: RIGHT ELBOW - COMPLETE 3+ VIEW COMPARISON:  None. FINDINGS: No acute fracture dislocation. No joint effusion. Degenerative spurring noted about the elbow. Osteopenia. No soft tissue abnormality. IMPRESSION: No acute osseous abnormality about the right elbow. Electronically Signed   By: Rise Mu M.D.   On: 04/16/2018 20:25   Dg Wrist Complete Right  Result Date: 04/16/2018 CLINICAL DATA:  Pt's son st's pt fell earlier today. Pt c/o pain in right hip, right elbow and back. Pt was ambulatory after the fall. Pt walks with a cane. Son also st's he thinks she may have a UTI . Pt's son also st's pt has multiple complaints and wants everything checked. Hx left hip replacement. Unable to obtain further history. EXAM: RIGHT WRIST - COMPLETE 3+ VIEW COMPARISON:  None. FINDINGS: No fracture Mild narrowing with small marginal osteophytes at the trapezium first metacarpal articulation consistent with osteoarthritis. Remaining joints normally spaced and aligned. Bones are demineralized. Soft tissues are unremarkable. IMPRESSION: No fracture or dislocation. Electronically Signed   By: Amie [place] M.D.   On: 04/16/2018 20:04   Ct Head Wo Contrast  Result Date: 04/16/2018 CLINICAL DATA:  Initial evaluation for acute trauma, fall. EXAM: CT HEAD WITHOUT CONTRAST CT CERVICAL SPINE WITHOUT CONTRAST TECHNIQUE: Multidetector CT imaging of the head and cervical spine was performed following the standard protocol without intravenous contrast. Multiplanar CT image reconstructions of the cervical spine were also generated. COMPARISON:  Prior CT from 01/05/2011. FINDINGS: CT HEAD FINDINGS Brain: Age-related cerebral atrophy with chronic small vessel ischemic disease. 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 No acute intracranial hemorrhage. No acute large vessel territory infarct. No mass lesion, midline shift or mass effect. No hydrocephalus. No extra-axial fluid collection. No hydrocephalus. Vascular: No hyperdense vessel. Calcified atherosclerosis at the skull base. Skull: Scalp soft tissues demonstrate no acute finding. Calvarium intact. Thinning and flattening of the right parietal calvarium noted. Sinuses/Orbits: Globes orbital soft tissues within normal limits. Paranasal sinuses are clear. Small right mastoid effusion noted, of doubtful significance. Other: None. CT CERVICAL SPINE FINDINGS Alignment: Straightening of the normal cervical lordosis. No listhesis or malalignment. Skull base and vertebrae: Skull base intact. Normal C1-2 articulations are preserved in the dens is intact. Vertebral body heights maintained. No acute fracture. Soft tissues and spinal canal: Soft tissues of the neck demonstrate no acute finding. No abnormal prevertebral edema. Spinal canal within normal limits. Disc levels: Mild to moderate cervical spondylolysis at C4-5 through C6-7. Prominent osteoarthritic changes about the C1-2 articulation. Upper chest: Visualized upper chest demonstrates no acute finding. Irregular pleuroparenchymal scarring present at the visualized lung apices. Other: None. IMPRESSION: CT BRAIN: 1. No acute intracranial abnormality. 2. Generalized age-related cerebral atrophy with mild to moderate chronic microvascular ischemic disease. CT CERVICAL SPINE: 1. No acute traumatic injury within the cervical spine. 2. Mild to moderate degenerate spondylolysis at C4-5 through C6-7. 3. Irregular pleuroparenchymal scarring at the lung apices bilaterally. Electronically Signed   By: Rise Mu M.D.   On: 04/16/2018 20:22   Ct Cervical Spine Wo Contrast  Result Date: 04/16/2018 CLINICAL DATA:  Initial evaluation for acute trauma, fall. EXAM: CT HEAD WITHOUT  CONTRAST CT CERVICAL SPINE WITHOUT CONTRAST TECHNIQUE: Multidetector CT imaging of the head and cervical spine was performed following the standard protocol without intravenous contrast. Multiplanar CT image reconstructions of the cervical spine were also  generated. COMPARISON:  Prior CT from 01/05/2011. FINDINGS: CT HEAD FINDINGS Brain: Age-related cerebral atrophy with chronic small vessel ischemic disease. Superimposed remote lacunar infarct at the left anterior corpus callosum. No acute intracranial hemorrhage. No acute large vessel territory infarct. No mass lesion, midline shift or mass effect. No hydrocephalus. No extra-axial fluid collection. No hydrocephalus. Vascular: No hyperdense vessel. Calcified atherosclerosis at the skull base. Skull: Scalp soft tissues demonstrate no acute finding. Calvarium intact. Thinning and flattening of the right parietal calvarium noted. Sinuses/Orbits: Globes orbital soft tissues within normal limits. Paranasal sinuses are clear. Small right mastoid effusion noted, of doubtful significance. Other: None. CT CERVICAL SPINE FINDINGS Alignment: Straightening of the normal cervical lordosis. No listhesis or malalignment. Skull base and vertebrae: Skull base intact. Normal C1-2 articulations are preserved in the dens is intact. Vertebral body heights maintained. No acute fracture. Soft tissues and spinal canal: Soft tissues of the neck demonstrate no acute finding. No abnormal prevertebral edema. Spinal canal within normal limits. Disc levels: Mild to moderate cervical spondylolysis at C4-5 through C6-7. Prominent osteoarthritic changes about the C1-2 articulation. Upper chest: Visualized upper chest demonstrates no acute finding. Irregular pleuroparenchymal scarring present at the visualized lung apices. Other: None. IMPRESSION: CT BRAIN: 1. No acute intracranial abnormality. 2. Generalized age-related cerebral atrophy with mild to moderate chronic microvascular ischemic disease.  CT CERVICAL SPINE: 1. No acute traumatic injury within the cervical spine. 2. Mild to moderate degenerate spondylolysis at C4-5 through C6-7. 3. Irregular pleuroparenchymal scarring at the lung apices bilaterally. Electronically Signed   By: Rise Mu M.D.   On: 04/16/2018 20:22   Dg Knee Complete 4 Views Right  Result Date: 04/16/2018 CLINICAL DATA:  Pt's son st's pt fell earlier today. Pt c/o pain in right hip, right elbow and back. Pt was ambulatory after the fall. Pt walks with a cane. Son also st's he thinks she may have a UTI . Pt's son also st's pt has multiple complaints and wants everything checked. Hx left hip replacement. Unable to obtain further history. EXAM: RIGHT KNEE - COMPLETE 4+ VIEW COMPARISON:  None. FINDINGS: No fracture or bone lesion. Knee joint is relatively well maintained. There are marginal osteophytes from all 3 compartments consistent with osteoarthritis. No joint effusion. There scattered arterial vascular calcifications posteriorly. IMPRESSION: 1. No fracture or acute finding. 2. Mild osteoarthritis. Electronically Signed   By: Amie Portland M.D.   On: 04/16/2018 20:08   Dg Hips Bilat W Or Wo Pelvis 3-4 Views  Result Date: 04/16/2018 CLINICAL DATA:  Initial evaluation for acute trauma, fall. EXAM: DG HIP (WITH OR WITHOUT PELVIS) 3-4V BILAT COMPARISON:  None. FINDINGS: No acute fracture or dislocation. Left total hip arthroplasty in place without complication. No acute osseous abnormality about the right hip. Bony pelvis intact. SI joints approximated. Degenerate spondylolysis present within the lower lumbar spine. Osteopenia noted. No soft tissue abnormality. IMPRESSION: 1. No acute osseous abnormality about the hips bilaterally. 2. Left hip arthroplasty in place without complication. Electronically Signed   By: Rise Mu M.D.   On: 04/16/2018 20:24    Procedures Procedures (including critical care time)  Medications Ordered in ED Medications    acetaminophen (TYLENOL) tablet 1,000 mg (has no administration in time range)     Initial Impression / Assessment and Plan / ED Course  I have reviewed the triage vital signs and the nursing notes.  Pertinent labs & imaging results that were available during my care of the patient were reviewed by me and  considered in my medical decision making (see chart for details).     Patient 85 year old who presents after mechanical fall.  She had x-rays that show no evidence of acute fracture or abnormality.  CT scan of the head and cervical spine reveal no acute abnormalities.  She is able to ambulate without significant pain.  She has no evidence of urinary tract infection.  She was encouraged to follow-up with her PCP.  Return precautions were given.  She was given Tylenol for pain in the ED, although she does not report significant discomfort.  Her son request a refill on the prescription that she had received on a prior ED visit for Pepcid.  Final Clinical Impressions(s) / ED Diagnoses   Final diagnoses:  Fall, initial encounter  Back strain, initial encounter  Contusion of right shoulder, initial encounter  Contusion of right hip, initial encounter  Minor head injury, initial encounter  Strain of neck muscle, initial encounter    ED Discharge Orders         Ordered    famotidine (PEPCID) 20 MG tablet  2 times daily     04/16/18 2034           Rolan Bucco, MD 04/16/18 2037

## 2018-04-16 NOTE — ED Triage Notes (Addendum)
Pt's son st's pt fell earlier today.  Pt c/o pain in right hip, right elbow and back.  Pt was ambulatory after the fall.  Pt walks with a cane.  Son also st's he thinks she may have a UTI .  Pt's son also st's pt has multiple complaints and wants everything checked

## 2018-04-16 NOTE — ED Notes (Signed)
Patient transported to CT an X-ray

## 2019-02-24 IMAGING — CR DG KNEE COMPLETE 4+V*R*
4 series · 4 of 4 positions shown · non-contrast
Comparison: None.

CLINICAL DATA: Pt's son st's pt fell earlier today. Pt c/o pain in
right hip, right elbow and back. Pt was ambulatory after the fall.
Pt walks with a cane. Son also st's he thinks she may have a UTI .
Pt's son also st's pt has multiple complaints and wants everything
checked. Hx left hip replacement. Unable to obtain further history.

EXAM:
RIGHT KNEE - COMPLETE 4+ VIEW

[knee ap]
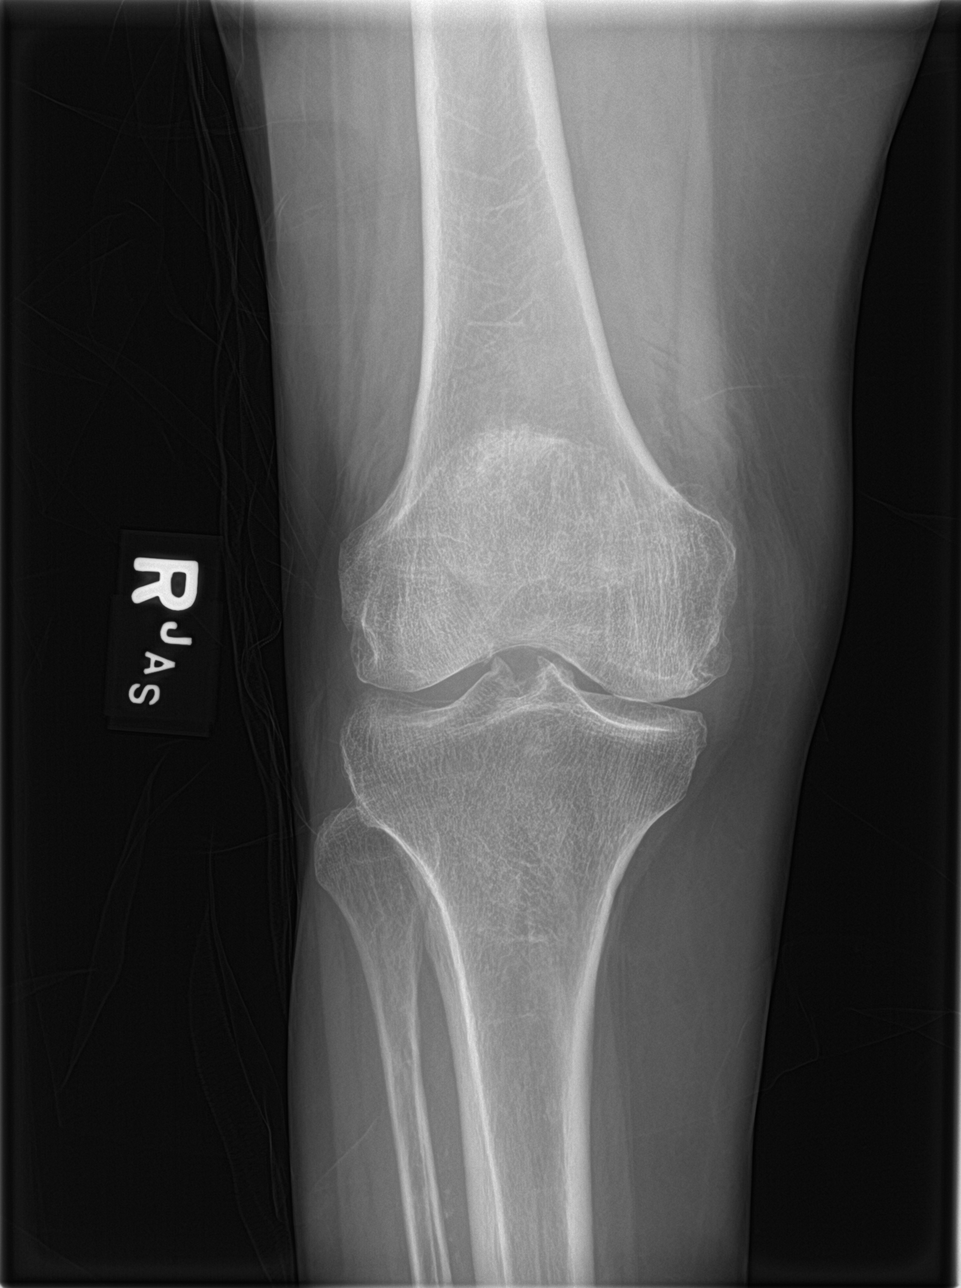

[knee lat]
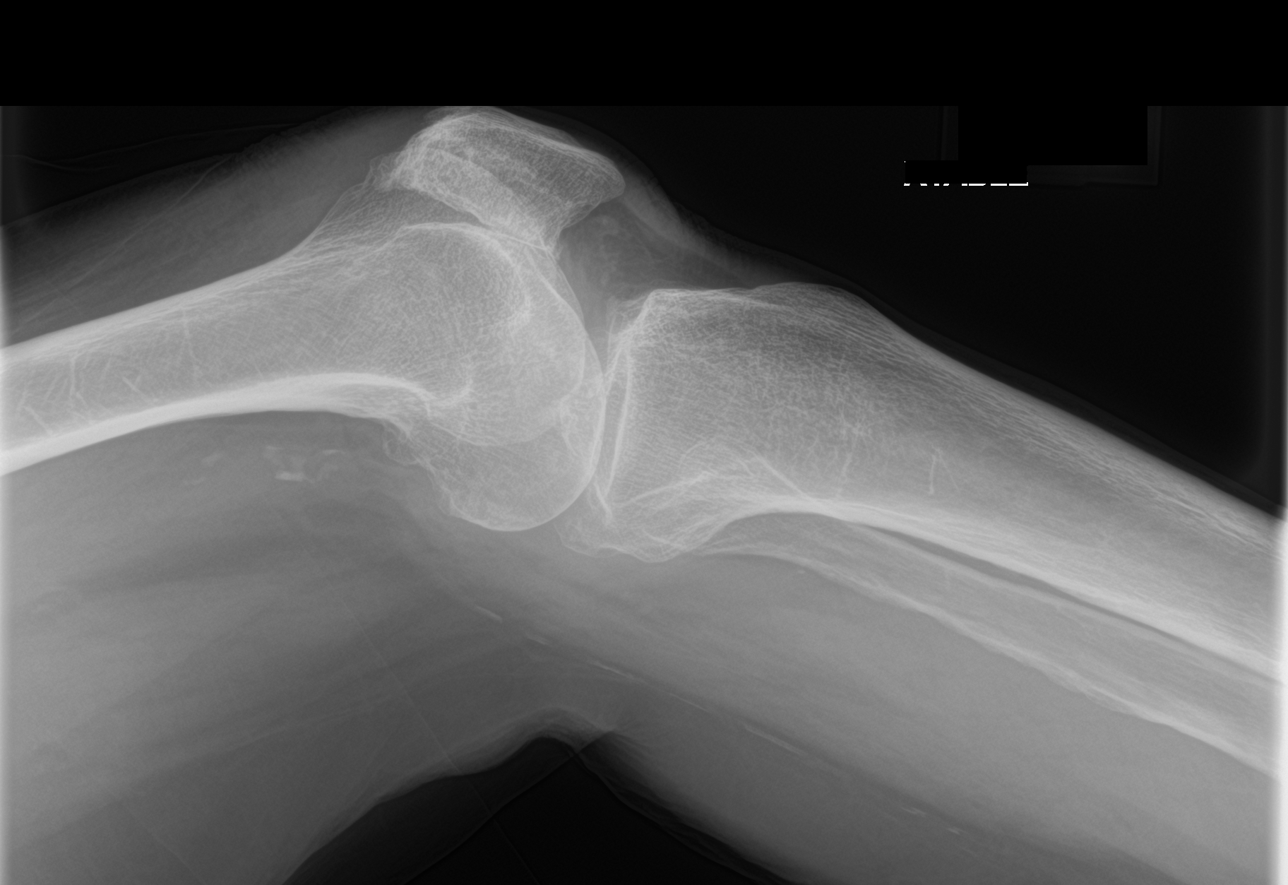

[knee obl (1 of 2)]
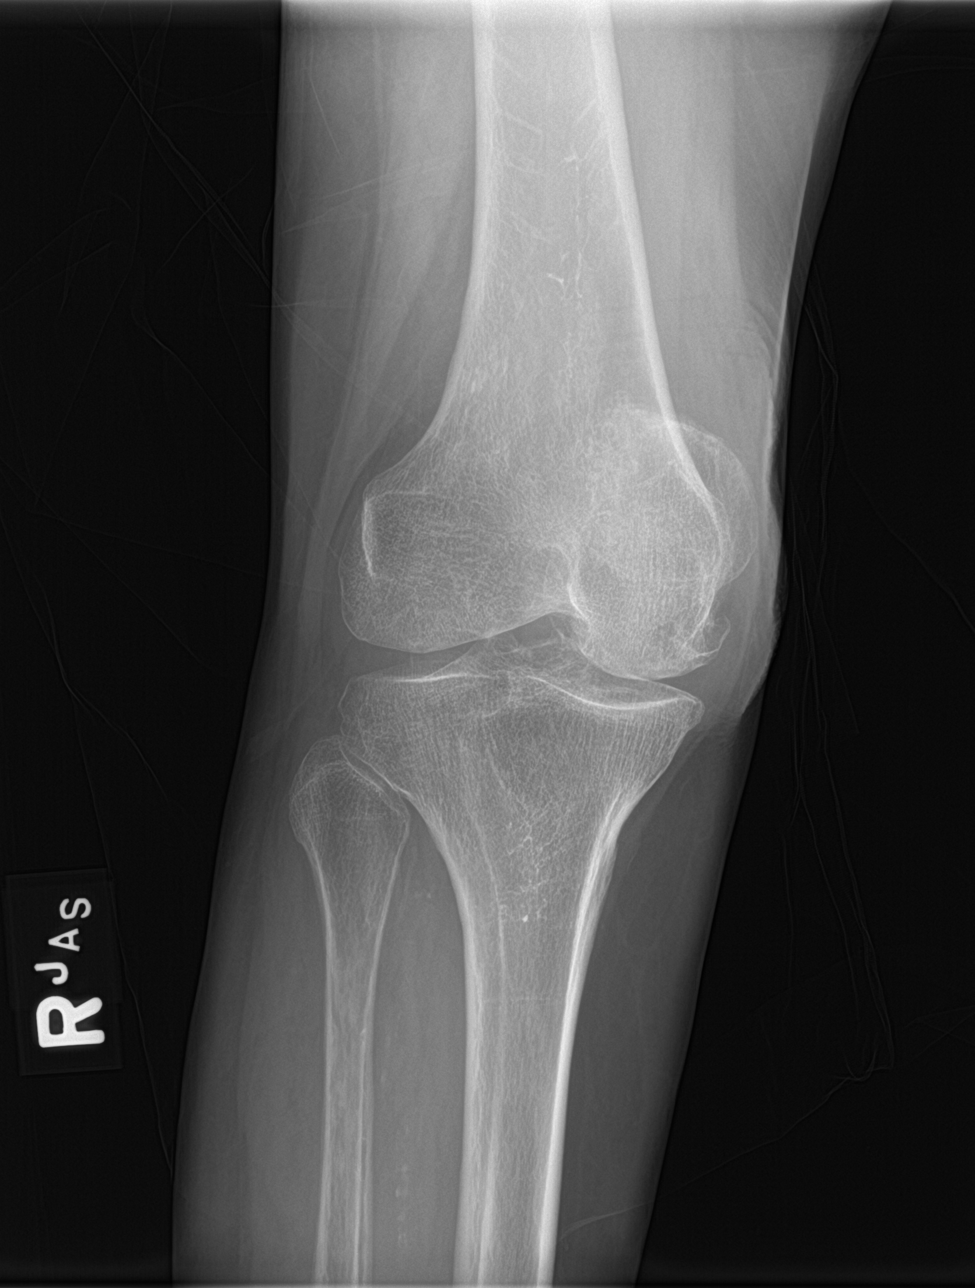

[knee obl (2 of 2)]
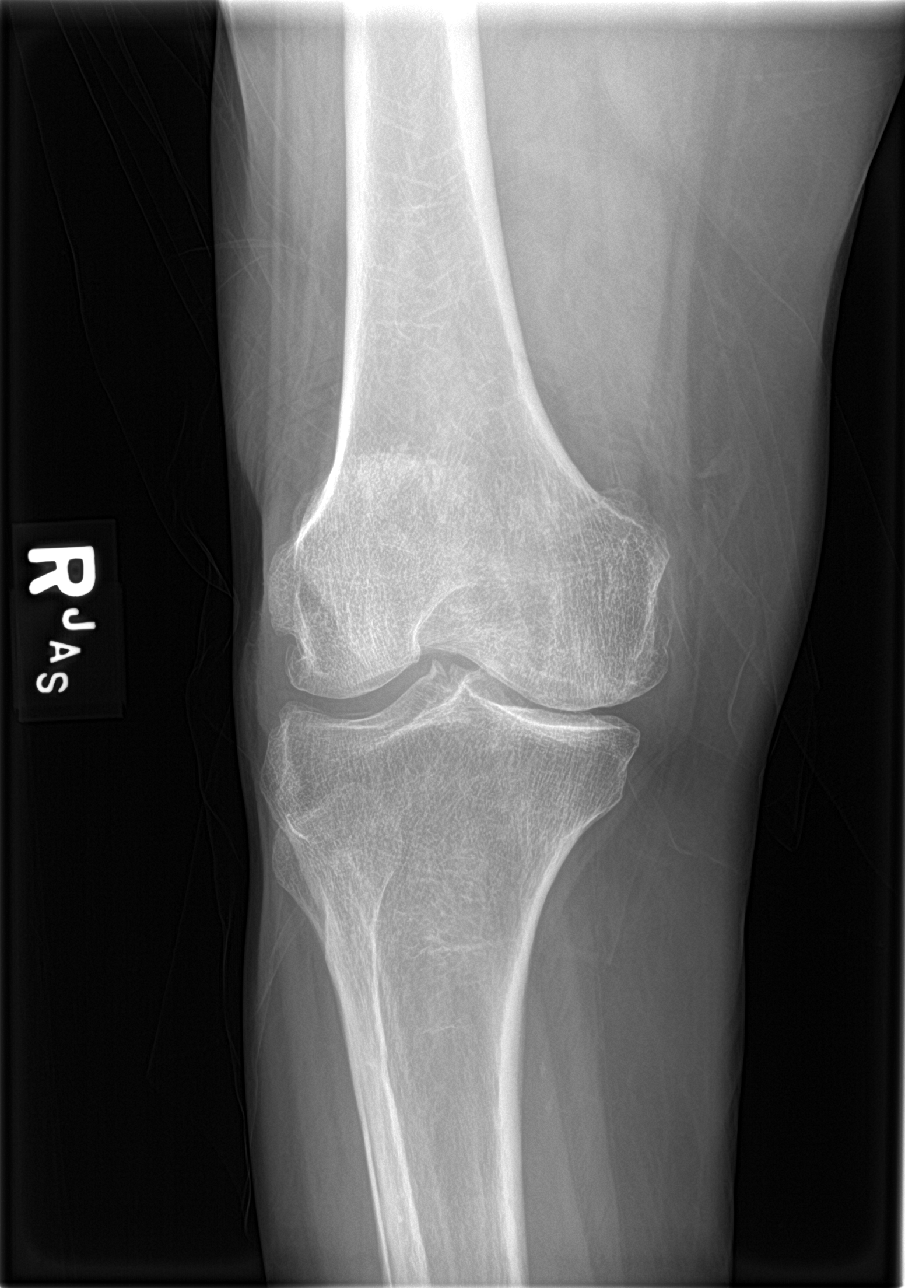

[4 of 4 positions shown; findings below may reference images not displayed]

FINDINGS: No fracture or bone lesion.

Knee joint is relatively well maintained. There are marginal
osteophytes from all 3 compartments consistent with osteoarthritis.
No joint effusion.

There scattered arterial vascular calcifications posteriorly.
IMPRESSION: 1. No fracture or acute finding.
2. Mild osteoarthritis.

## 2019-02-24 IMAGING — CR DG SHOULDER 2+V*R*
2 series · 2 of 2 positions shown · non-contrast
Comparison: None.

CLINICAL DATA: Pt's son st's pt fell earlier today. Pt c/o pain in
right hip, right elbow and back. Pt was ambulatory after the fall.
Pt walks with a cane. Son also st's he thinks she may have a UTI .
Pt's son also st's pt has multiple complaints and wants everything
checked. Hx left hip replacement. Unable to obtain further history.

EXAM:
RIGHT SHOULDER - 2+ VIEW

[shoulder grashey]
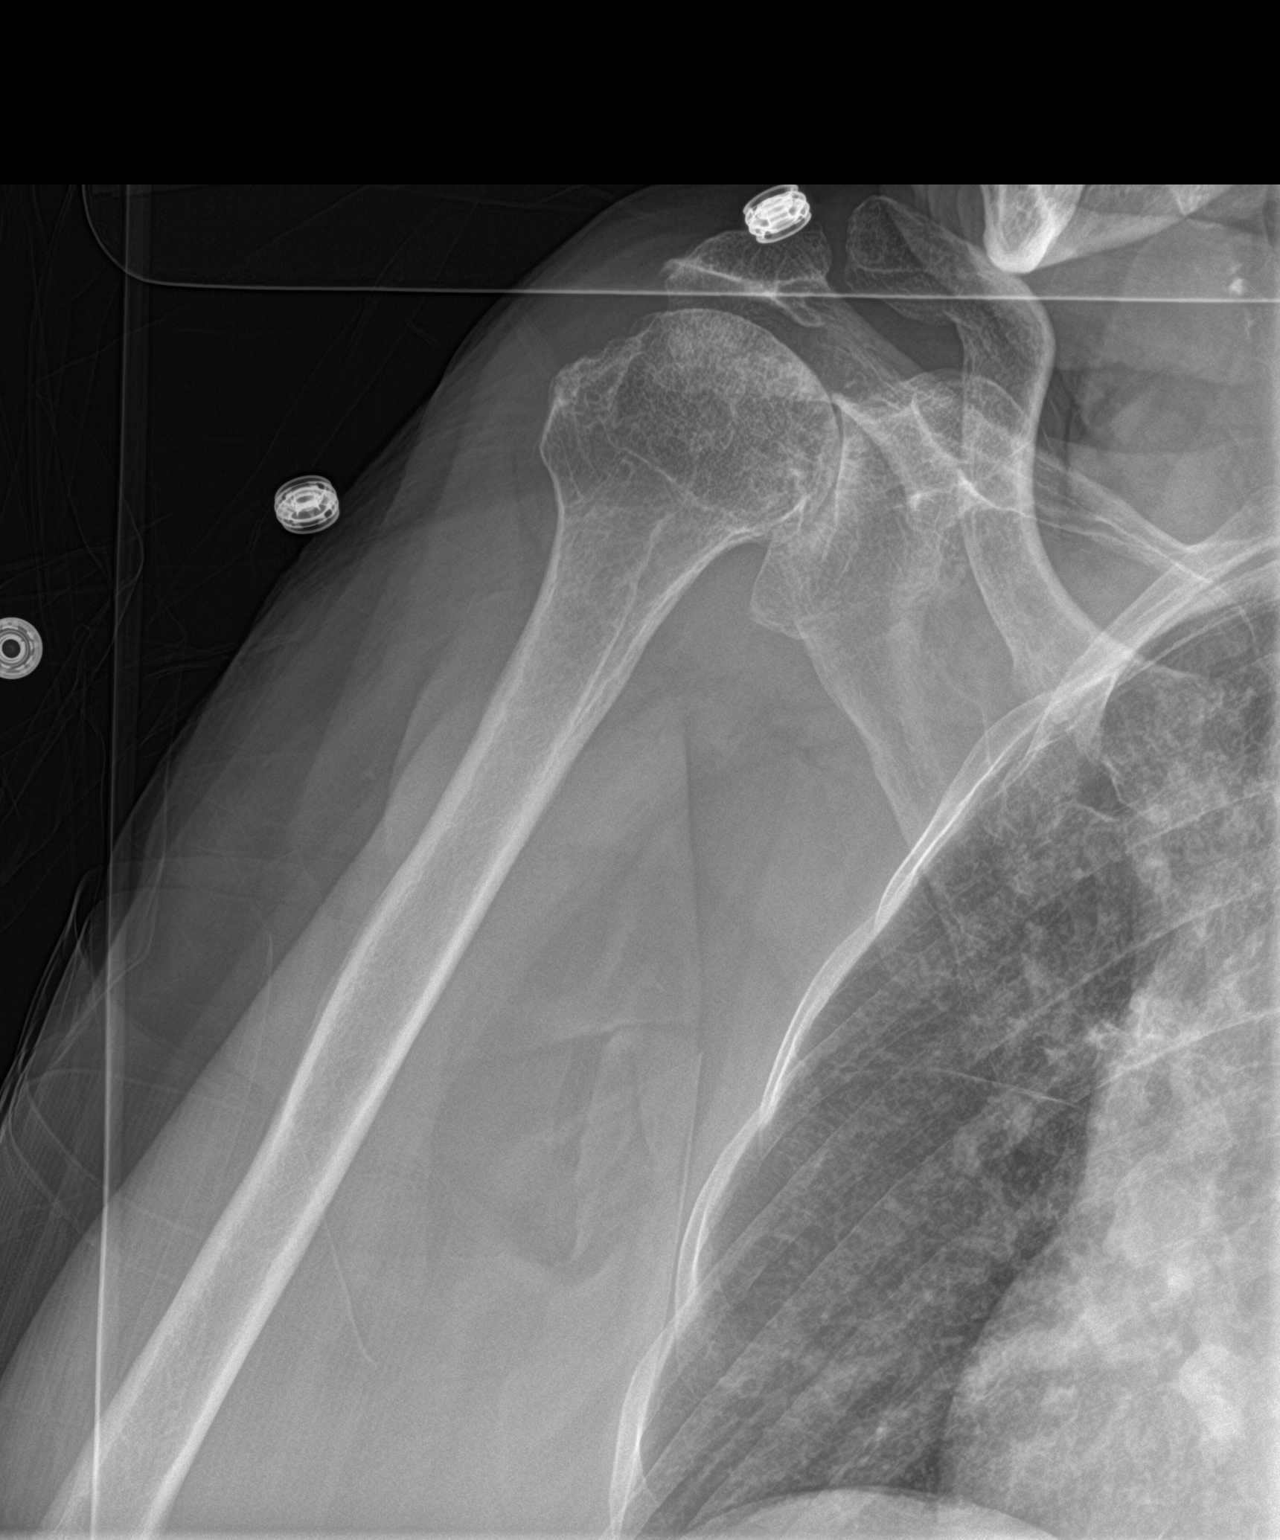

[shoulder y view]
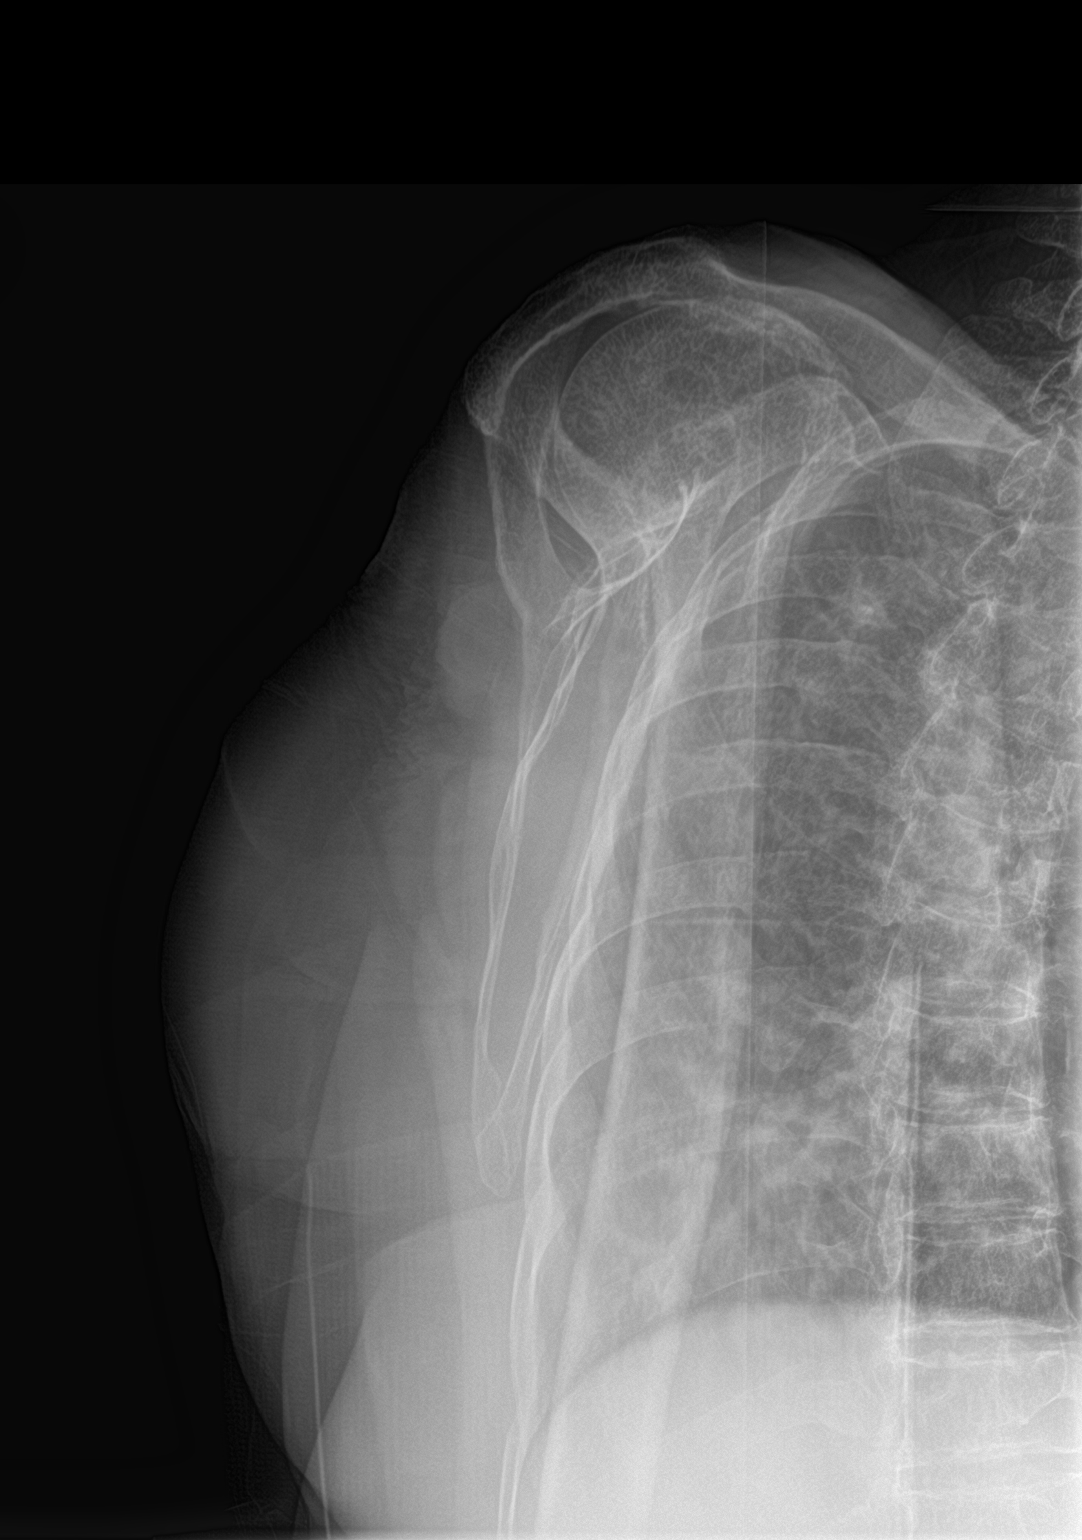

[2 of 2 positions shown; findings below may reference images not displayed]

FINDINGS: No fracture or dislocation.

Marked narrowing of the glenohumeral joint space with mild
subchondral sclerosis along the upper glenoid. Subacromial spur.
Mild narrowing of the subacromial space to 5 mm. AC joint normally
aligned.

Bones are demineralized.

Soft tissues are unremarkable.
IMPRESSION: 1. No fracture or dislocation.
2. Glenohumeral joint arthropathic changes with marked joint space
narrowing.
3. Mild narrowing of the subacromial space and subacromial spur.
This raises the possibility of a chronic rotator cuff tear.

## 2020-01-18 ENCOUNTER — Other Ambulatory Visit: Payer: Self-pay

## 2020-01-18 ENCOUNTER — Other Ambulatory Visit: Payer: Self-pay | Admitting: Family Medicine

## 2020-01-18 ENCOUNTER — Encounter: Payer: Self-pay | Admitting: Family Medicine

## 2020-01-18 ENCOUNTER — Ambulatory Visit: Payer: Self-pay | Attending: Family Medicine | Admitting: Family Medicine

## 2020-01-18 VITALS — BP 210/63 | HR 80 | Wt 122.0 lb

## 2020-01-18 DIAGNOSIS — K219 Gastro-esophageal reflux disease without esophagitis: Secondary | ICD-10-CM

## 2020-01-18 DIAGNOSIS — M542 Cervicalgia: Secondary | ICD-10-CM

## 2020-01-18 DIAGNOSIS — N3000 Acute cystitis without hematuria: Secondary | ICD-10-CM

## 2020-01-18 DIAGNOSIS — I10 Essential (primary) hypertension: Secondary | ICD-10-CM

## 2020-01-18 LAB — POCT URINALYSIS DIP (CLINITEK)
Bilirubin, UA: NEGATIVE
Glucose, UA: NEGATIVE mg/dL
Ketones, POC UA: NEGATIVE mg/dL
Nitrite, UA: NEGATIVE
Spec Grav, UA: 1.02 (ref 1.010–1.025)
Urobilinogen, UA: 0.2 E.U./dL
pH, UA: 6.5 (ref 5.0–8.0)

## 2020-01-18 MED ORDER — OMEPRAZOLE 40 MG PO CPDR: 40.0000 mg | DELAYED_RELEASE_CAPSULE | Freq: Every day | ORAL | 3 refills | Status: DC

## 2020-01-18 MED ORDER — HYDROCHLOROTHIAZIDE 25 MG PO TABS: 25.0000 mg | ORAL_TABLET | Freq: Every day | ORAL | 3 refills | Status: DC

## 2020-01-18 MED ORDER — AMLODIPINE BESYLATE 5 MG PO TABS: 5.0000 mg | ORAL_TABLET | Freq: Every day | ORAL | 3 refills | Status: DC

## 2020-01-18 MED ORDER — CEPHALEXIN 500 MG PO CAPS: 500.0000 mg | ORAL_CAPSULE | Freq: Two times a day (BID) | ORAL | 0 refills | Status: DC

## 2020-01-18 MED ORDER — CLONIDINE HCL 0.1 MG PO TABS
0.1000 mg | ORAL_TABLET | Freq: Once | ORAL | Status: AC
  Administered 2020-01-18: 0.1 mg via ORAL

## 2020-01-18 NOTE — Progress Notes (Signed)
Has been having pain in her neck.  Has been having ringing in ears.  Having trouble with acid reflux.  States that she has pain all over body.  Has sour taste in mouth.  Wants to get urine tested.  Has not been taking BP medication.

## 2020-01-18 NOTE — Progress Notes (Signed)
Subjective:  Patient ID: Amber Fisher, female    DOB: 1934-01-31  Age: 86 y.o. MRN: [id number]  CC: New Patient (Initial Visit)   HPI Amber Fisher is an 86 year old female who was previously followed by Dr Jonelle Sidle as her PCP.  She presents today to establish care accompanied by her son.  Her BP is severely elevated at 210/63 and she is yet to take her antihypertensive.  She has a list of complaints which she reported to the nurse.  I have inquired about the most important concerns and son states she had reflux symptoms which is uncontrolled. She should be on Omeprazole and HCTZ which she has not been compliant with.  She also complains of a sour taste in her mouth. Complains of headache in the occiput and neck pain with associated neck stiffness.  Denies blurry vision, lightheadedness and per son neck stiffness and headaches have been present for several weeks. He decided to switch her care here because he was not satisfied with her care previously. She would like her urine checked for UTI Past Medical History:  Diagnosis Date  . Hypertension     Past Surgical History:  Procedure Laterality Date  . HIP SURGERY Left     History reviewed. No pertinent family history.  No Known Allergies  Outpatient Medications Prior to Visit  Medication Sig Dispense Refill  . hydrochlorothiazide (HYDRODIURIL) 25 MG tablet Take 25 mg by mouth daily.    Marland Kitchen omeprazole (PRILOSEC) 20 MG capsule Take 20 mg by mouth daily.     No facility-administered medications prior to visit.     ROS Review of Systems  Constitutional: Negative for activity change, appetite change and fatigue.  HENT: Negative for congestion, sinus pressure and sore throat.   Eyes: Negative for visual disturbance.  Respiratory: Negative for cough, chest tightness, shortness of breath and wheezing.   Cardiovascular: Negative for chest pain and palpitations.  Gastrointestinal: Negative for abdominal distention, abdominal pain and  constipation.  Endocrine: Negative for polydipsia.  Genitourinary: Negative for dysuria and frequency.  Musculoskeletal: Positive for neck pain. Negative for arthralgias and back pain.  Skin: Negative for rash.  Neurological: Positive for headaches. Negative for tremors, light-headedness and numbness.  Hematological: Does not bruise/bleed easily.  Psychiatric/Behavioral: Negative for agitation and behavioral problems.    Objective:  BP (!) 210/63   Pulse 80   Wt 122 lb (55.3 kg)   SpO2 96%   BP/Weight 29/07/1882  Systolic BP 166  Diastolic BP 63  Wt. (Lbs) 122      Physical Exam Constitutional:      Appearance: She is well-developed.  Neck:     Vascular: No JVD.     Comments: Reduced lateral rotation of neck, reduced cervical spine extension but normal flexion. Cardiovascular:     Rate and Rhythm: Normal rate.     Heart sounds: Normal heart sounds. No murmur heard.   Pulmonary:     Effort: Pulmonary effort is normal.     Breath sounds: Normal breath sounds. No wheezing or rales.  Chest:     Chest wall: No tenderness.  Abdominal:     General: Bowel sounds are normal. There is no distension.     Palpations: Abdomen is soft. There is no mass.     Tenderness: There is no abdominal tenderness.  Musculoskeletal:        General: Normal range of motion.     Right lower leg: No edema.     Left lower leg: No edema.  Neurological:     Mental Status: She is alert and oriented to person, place, and time.  Psychiatric:        Mood and Affect: Mood normal.       Assessment & Plan:  1. Accelerated hypertension Uncontrolled due to noncompliance I have stressed the importance of compliance and implications of noncompliance including with her Clonidine 0.1 mg administered in the clinic and patient observed for 30 minutes prior to repeating blood pressure No evidence of endorgan damage at this time Amlodipine added to regimen; will titrate at next visit to prevent rapid  decrease in blood pressure. Counseled on blood pressure goal of less than 130/80, low-sodium, DASH diet, medication compliance, 150 minutes of moderate intensity exercise per week. Discussed medication compliance, adverse effects. - CMP14+EGFR - CBC with Differential/Platelet - amLODipine (NORVASC) 5 MG tablet; Take 1 tablet (5 mg total) by mouth daily.  Dispense: 30 tablet; Refill: 3 - hydrochlorothiazide (HYDRODIURIL) 25 MG tablet; Take 1 tablet (25 mg total) by mouth daily.  Dispense: 30 tablet; Refill: 3 - cloNIDine (CATAPRES) tablet 0.1 mg  2. Acute cystitis without hematuria - POCT URINALYSIS DIP (CLINITEK) - cephALEXin (KEFLEX) 500 MG capsule; Take 1 capsule (500 mg total) by mouth 2 (two) times daily.  Dispense: 6 capsule; Refill: 0  3. Neck pain Likely chronic from possibly underlying osteoarthritis Advised to use Tylenol as we do not have baseline labs on her  4. Gastroesophageal reflux disease without esophagitis Uncontrolled Could explain sour taste in mouth Advised to be compliant with omeprazole and I have increased her dose - omeprazole (PRILOSEC) 40 MG capsule; Take 1 capsule (40 mg total) by mouth daily.  Dispense: 30 capsule; Refill: 3    Meds ordered this encounter  Medications  . amLODipine (NORVASC) 5 MG tablet    Sig: Take 1 tablet (5 mg total) by mouth daily.    Dispense:  30 tablet    Refill:  3  . omeprazole (PRILOSEC) 40 MG capsule    Sig: Take 1 capsule (40 mg total) by mouth daily.    Dispense:  30 capsule    Refill:  3  . hydrochlorothiazide (HYDRODIURIL) 25 MG tablet    Sig: Take 1 tablet (25 mg total) by mouth daily.    Dispense:  30 tablet    Refill:  3  . cloNIDine (CATAPRES) tablet 0.1 mg  . cephALEXin (KEFLEX) 500 MG capsule    Sig: Take 1 capsule (500 mg total) by mouth 2 (two) times daily.    Dispense:  6 capsule    Refill:  0    Follow-up: Return in about 2 weeks (around 02/01/2020) for additional concerns.       Charlott Rakes, MD, FAAFP. [facility] [place], [place]   01/18/2020, 5:37 PM

## 2020-01-18 NOTE — Patient Instructions (Signed)

## 2020-01-19 LAB — CMP14+EGFR
ALT: 11 [IU]/L (ref 0–32)
AST: 29 [IU]/L (ref 0–40)
Albumin/Globulin Ratio: 1.1 — ABNORMAL LOW (ref 1.2–2.2)
Albumin: 4.1 g/dL (ref 3.6–4.6)
Alkaline Phosphatase: 101 [IU]/L (ref 44–121)
BUN/Creatinine Ratio: 23 (ref 12–28)
BUN: 19 mg/dL (ref 8–27)
Bilirubin Total: 0.3 mg/dL (ref 0.0–1.2)
CO2: 20 mmol/L (ref 20–29)
Calcium: 9.3 mg/dL (ref 8.7–10.3)
Chloride: 97 mmol/L (ref 96–106)
Creatinine, Ser: 0.84 mg/dL (ref 0.57–1.00)
GFR calc Af Amer: 74 mL/min/{1.73_m2}
GFR calc non Af Amer: 64 mL/min/{1.73_m2}
Globulin, Total: 3.9 g/dL (ref 1.5–4.5)
Glucose: 84 mg/dL (ref 65–99)
Potassium: 4.5 mmol/L (ref 3.5–5.2)
Sodium: 137 mmol/L (ref 134–144)
Total Protein: 8 g/dL (ref 6.0–8.5)

## 2020-01-19 LAB — CBC WITH DIFFERENTIAL/PLATELET
Basophils Absolute: 0.1 10*3/uL (ref 0.0–0.2)
Basos: 1 %
EOS (ABSOLUTE): 0.4 10*3/uL (ref 0.0–0.4)
Eos: 3 %
Hematocrit: 37.4 % (ref 34.0–46.6)
Hemoglobin: 12.5 g/dL (ref 11.1–15.9)
Immature Grans (Abs): 0 10*3/uL (ref 0.0–0.1)
Immature Granulocytes: 0 %
Lymphocytes Absolute: 3 10*3/uL (ref 0.7–3.1)
Lymphs: 27 %
MCH: 30.3 pg (ref 26.6–33.0)
MCHC: 33.4 g/dL (ref 31.5–35.7)
MCV: 91 fL (ref 79–97)
Monocytes Absolute: 0.6 10*3/uL (ref 0.1–0.9)
Monocytes: 5 %
Neutrophils Absolute: 7.1 10*3/uL — ABNORMAL HIGH (ref 1.4–7.0)
Neutrophils: 64 %
Platelets: 340 10*3/uL (ref 150–450)
RBC: 4.12 x10E6/uL (ref 3.77–5.28)
RDW: 15.7 % — ABNORMAL HIGH (ref 11.7–15.4)
WBC: 11.1 10*3/uL — ABNORMAL HIGH (ref 3.4–10.8)

## 2020-01-23 ENCOUNTER — Telehealth: Payer: Self-pay

## 2020-01-23 NOTE — Telephone Encounter (Signed)
-----   Message from Hoy Register, MD sent at 01/19/2020 12:13 PM EDT ----- White blood cell count is elevated which could be due to her acute UTI for which she received an antibiotic yesterday.  Other labs are stable.

## 2020-01-23 NOTE — Telephone Encounter (Signed)
Lab results were given to patient's son.

## 2020-02-28 ENCOUNTER — Encounter: Payer: Self-pay | Admitting: Family Medicine

## 2020-02-28 ENCOUNTER — Ambulatory Visit: Payer: Self-pay | Attending: Family Medicine | Admitting: Family Medicine

## 2020-02-28 ENCOUNTER — Other Ambulatory Visit: Payer: Self-pay | Admitting: Family Medicine

## 2020-02-28 ENCOUNTER — Other Ambulatory Visit: Payer: Self-pay

## 2020-02-28 VITALS — BP 196/112 | HR 82 | Ht 64.0 in | Wt 123.0 lb

## 2020-02-28 DIAGNOSIS — K219 Gastro-esophageal reflux disease without esophagitis: Secondary | ICD-10-CM

## 2020-02-28 DIAGNOSIS — M25511 Pain in right shoulder: Secondary | ICD-10-CM

## 2020-02-28 DIAGNOSIS — G8929 Other chronic pain: Secondary | ICD-10-CM

## 2020-02-28 DIAGNOSIS — I10 Essential (primary) hypertension: Secondary | ICD-10-CM

## 2020-02-28 DIAGNOSIS — Z9114 Patient's other noncompliance with medication regimen: Secondary | ICD-10-CM

## 2020-02-28 DIAGNOSIS — M542 Cervicalgia: Secondary | ICD-10-CM

## 2020-02-28 MED ORDER — DICLOFENAC SODIUM 1 % EX GEL
4.0000 g | Freq: Four times a day (QID) | CUTANEOUS | 2 refills | Status: DC
Start: 2020-02-28 — End: 2020-05-23

## 2020-02-28 MED ORDER — LIDOCAINE 5 % EX PTCH: 1.0000 | MEDICATED_PATCH | CUTANEOUS | 2 refills | Status: DC

## 2020-02-28 NOTE — Progress Notes (Signed)
Subjective:  Patient ID: Amber Fisher, female    DOB: [birth year]  Age: 86 y.o. MRN: [id number]  CC: Hypertension   HPI Amber Fisher is an 86 year old female with a history of hypertension, GERD who presents today for follow-up on her blood pressure. At her last visit her BP was elevated at 210/63 and she was yet to take her antihypertensive. Today her blood pressure is elevated and she has run out of her antihypertensive per her son. She had also complained of reflux symptoms for which a PPI has been initiated. She also ran out of her PPI  Complains of pain in her neck , shoulder, and her entire body. Complains of limited ROM of her neck and R shoulder and symptoms have been present for  For a while.  Past Medical History:  Diagnosis Date  . Hypertension     Past Surgical History:  Procedure Laterality Date  . HIP SURGERY Left     No family history on file.  No Known Allergies  Outpatient Medications Prior to Visit  Medication Sig Dispense Refill  . amLODipine (NORVASC) 5 MG tablet Take 1 tablet (5 mg total) by mouth daily. 30 tablet 3  . hydrochlorothiazide (HYDRODIURIL) 25 MG tablet Take 1 tablet (25 mg total) by mouth daily. 30 tablet 3  . omeprazole (PRILOSEC) 40 MG capsule Take 1 capsule (40 mg total) by mouth daily. 30 capsule 3  . cephALEXin (KEFLEX) 500 MG capsule Take 1 capsule (500 mg total) by mouth 2 (two) times daily. (Patient not taking: Reported on 02/28/2020) 6 capsule 0   No facility-administered medications prior to visit.     ROS Review of Systems  Constitutional: Negative for activity change, appetite change and fatigue.  HENT: Negative for congestion, sinus pressure and sore throat.   Eyes: Negative for visual disturbance.  Respiratory: Negative for cough, chest tightness, shortness of breath and wheezing.   Cardiovascular: Negative for chest pain and palpitations.  Gastrointestinal: Negative for abdominal distention, abdominal pain and  constipation.  Endocrine: Negative for polydipsia.  Genitourinary: Negative for dysuria and frequency.  Musculoskeletal:       See HPI  Skin: Negative for rash.  Neurological: Negative for tremors, light-headedness and numbness.  Hematological: Does not bruise/bleed easily.  Psychiatric/Behavioral: Negative for agitation and behavioral problems.    Objective:  Wt 123 lb (55.8 kg)   BP/Weight 02/28/2020 01/18/2020  Systolic BP - 210  Diastolic BP - 63  Wt. (Lbs) 123 122      Physical Exam Constitutional:      Appearance: She is well-developed.  Neck:     Vascular: No JVD.  Cardiovascular:     Rate and Rhythm: Normal rate.     Heart sounds: Normal heart sounds. No murmur heard.   Pulmonary:     Effort: Pulmonary effort is normal.     Breath sounds: Normal breath sounds. No wheezing or rales.  Chest:     Chest wall: No tenderness.  Abdominal:     General: Bowel sounds are normal. There is no distension.     Palpations: Abdomen is soft. There is no mass.     Tenderness: There is no abdominal tenderness.  Musculoskeletal:        General: Normal range of motion.     Right lower leg: No edema.     Left lower leg: No edema.  Neurological:     Mental Status: She is alert and oriented to person, place, and time.  Psychiatric:  Mood and Affect: Mood normal.     CMP Latest Ref Rng & Units 01/18/2020  Glucose 65 - 99 mg/dL 84  BUN 8 - 27 mg/dL 19  Creatinine 1.61 - 0.96 mg/dL 0.45  Sodium 409 - 811 mmol/L 137  Potassium 3.5 - 5.2 mmol/L 4.5  Chloride 96 - 106 mmol/L 97  CO2 20 - 29 mmol/L 20  Calcium 8.7 - 10.3 mg/dL 9.3  Total Protein 6.0 - 8.5 g/dL 8.0  Total Bilirubin 0.0 - 1.2 mg/dL 0.3  Alkaline Phos 44 - 121 IU/L 101  AST 0 - 40 IU/L 29  ALT 0 - 32 IU/L 11    Lipid Panel  No results found for: CHOL, TRIG, HDL, CHOLHDL, VLDL, LDLCALC, LDLDIRECT  CBC    Component Value Date/Time   WBC 11.1 (H) 01/18/2020 1548   RBC 4.12 01/18/2020 1548   HGB 12.5  01/18/2020 1548   HCT 37.4 01/18/2020 1548   PLT 340 01/18/2020 1548   MCV 91 01/18/2020 1548   MCH 30.3 01/18/2020 1548   MCHC 33.4 01/18/2020 1548   RDW 15.7 (H) 01/18/2020 1548   LYMPHSABS 3.0 01/18/2020 1548   EOSABS 0.4 01/18/2020 1548   BASOSABS 0.1 01/18/2020 1548    No results found for: HGBA1C  Assessment & Plan:  1. Neck pain Likely underlying osteoarthritis - lidocaine (LIDODERM) 5 %; Place 1 patch onto the skin daily. Remove & Discard patch within 12 hours or as directed by MD  Dispense: 30 patch; Refill: 2 - diclofenac Sodium (VOLTAREN) 1 % GEL; Apply 4 g topically 4 (four) times daily.  Dispense: 100 g; Refill: 2 - Ambulatory referral to Physical Therapy  2. Chronic right shoulder pain Adhesive capsulitis - lidocaine (LIDODERM) 5 %; Place 1 patch onto the skin daily. Remove & Discard patch within 12 hours or as directed by MD  Dispense: 30 patch; Refill: 2 - diclofenac Sodium (VOLTAREN) 1 % GEL; Apply 4 g topically 4 (four) times daily.  Dispense: 100 g; Refill: 2 - Ambulatory referral to Physical Therapy  3. Accelerated hypertension Accelerated hypertension with risk of endorgan damage, bodily harm Uncontrolled due to noncompliance I have stressed the importance of compliance with her son and advised to request refills from the pharmacy prior to running out Counseled on blood pressure goal of less than 130/80, low-sodium, DASH diet, medication compliance, 150 minutes of moderate intensity exercise per week. Discussed medication compliance, adverse effects.  4. Gastroesophageal reflux disease without esophagitis Uncontrolled due to running out of PPI Advised to stop by the pharmacy to pick up refill  5. Non compliance w medication regimen Emphasized the need to be compliant with medications Discussed complications of noncompliance including stroke and death    No orders of the defined types were placed in this encounter.   Return in about 6 weeks (around  04/10/2020) for Chronic disease management.       Hoy Register, MD, FAAFP. [facility] [place], [place] [phone]   02/28/2020, 2:41 PM

## 2020-02-28 NOTE — Progress Notes (Signed)
Hearing sounds in her ears. Having pain in neck. Acid reflux sour taste in mouth Has constipation, pain in left side. No BP medication in 2 days.

## 2020-03-13 ENCOUNTER — Emergency Department (HOSPITAL_COMMUNITY)
Admission: EM | Admit: 2020-03-13 | Discharge: 2020-03-13 | Disposition: A | Discharge status: Home / Self Care | Payer: Medicare Other | Attending: Emergency Medicine | Admitting: Emergency Medicine

## 2020-03-13 ENCOUNTER — Emergency Department (HOSPITAL_COMMUNITY): Payer: Medicare Other

## 2020-03-13 ENCOUNTER — Encounter (HOSPITAL_COMMUNITY): Payer: Self-pay | Admitting: Emergency Medicine

## 2020-03-13 DIAGNOSIS — I10 Essential (primary) hypertension: Secondary | ICD-10-CM | POA: Insufficient documentation

## 2020-03-13 DIAGNOSIS — R519 Headache, unspecified: Secondary | ICD-10-CM | POA: Insufficient documentation

## 2020-03-13 DIAGNOSIS — M542 Cervicalgia: Secondary | ICD-10-CM | POA: Diagnosis present

## 2020-03-13 DIAGNOSIS — Z96642 Presence of left artificial hip joint: Secondary | ICD-10-CM | POA: Diagnosis not present

## 2020-03-13 DIAGNOSIS — R200 Anesthesia of skin: Secondary | ICD-10-CM | POA: Diagnosis not present

## 2020-03-13 DIAGNOSIS — Z7901 Long term (current) use of anticoagulants: Secondary | ICD-10-CM | POA: Insufficient documentation

## 2020-03-13 LAB — CBC
HCT: 37.9 % (ref 36.0–46.0)
Hemoglobin: 12.5 g/dL (ref 12.0–15.0)
MCH: 29.9 pg (ref 26.0–34.0)
MCHC: 33 g/dL (ref 30.0–36.0)
MCV: 90.7 fL (ref 80.0–100.0)
Platelets: 406 10*3/uL — ABNORMAL HIGH (ref 150–400)
RBC: 4.18 MIL/uL (ref 3.87–5.11)
RDW: 15 % (ref 11.5–15.5)
WBC: 10.2 10*3/uL (ref 4.0–10.5)
nRBC: 0 % (ref 0.0–0.2)

## 2020-03-13 LAB — BASIC METABOLIC PANEL
Anion gap: 13 (ref 5–15)
BUN: 21 mg/dL (ref 8–23)
CO2: 24 mmol/L (ref 22–32)
Calcium: 9.3 mg/dL (ref 8.9–10.3)
Chloride: 98 mmol/L (ref 98–111)
Creatinine, Ser: 0.9 mg/dL (ref 0.44–1.00)
GFR, Estimated: >60 mL/min (ref >60)
Glucose, Bld: 91 mg/dL (ref 70–99)
Potassium: 4.1 mmol/L (ref 3.5–5.1)
Sodium: 135 mmol/L (ref 135–145)

## 2020-03-13 MED ORDER — TRAMADOL HCL 50 MG PO TABS: 50.0000 mg | ORAL_TABLET | Freq: Four times a day (QID) | ORAL | 0 refills | Status: AC | PRN

## 2020-03-13 MED ORDER — TRAMADOL HCL 50 MG PO TABS
50.0000 mg | ORAL_TABLET | Freq: Once | ORAL | Status: AC
  Administered 2020-03-13: 50 mg via ORAL
  Filled 2020-03-13: qty 1

## 2020-03-13 NOTE — ED Provider Notes (Signed)
[facility] EMERGENCY DEPARTMENT Provider Note   CSN: [id number] Arrival date & time: 03/13/20  1324     History Chief Complaint  Patient presents with  . Headache  . Hypertension    Amber Fisher is a 86 y.o. female.  Patient is a 86 year old female who presents with neck pain.  She speaks Somali in and initially history was obtained through the son.  However I did advise that we better to get a video language interpreter which we did.  However it did not go well.  She didn't answer questions directly and would frequently apparently go off on tangents of the interpreter was have a hard time really what she was saying.  History was obtained partly through the interpreter and partly through the son.  He states that she has been having some neck pain for the last several months but is been getting worse.  It is in the back of her neck and radiates down both of her arms.  She describes it radiating to her shoulders but it is unclear if it is radiating all the way down her arms.  She describes some intermittent numbness in her palms.  She says it is hard to do her hair now because of the neck pain.  It is unclear if this is because of weakness or pain.  She describes an associated headache.  It sounds like the pain in her neck radiates up to her head but is unclear if this is the same pain or if she has a separate headache.  Per the son, she is said in the past that it starts in the neck and radiates to the head.  Of note she is on Eliquis but denies any recent falls.  At times she complains of other things such as reflux, fevers, chills but per the son, she doesn't have any other new symptoms other than the neck pain.  It seemed that she may have been confused through the interpreter however the son said that she is oriented and answers questions normally it is just that she has a hard time staying on task and answering questions appropriately.  This is not new and she is otherwise  been acting appropriately per her son.  He hasn't noted any fevers cough congestion or other recent illnesses.  She doesn't have any current chest pain or shortness of breath.  No abdominal pain.  The son states that she has been seeing her primary care doctor for the neck pain but he doesn't know if she has any medications currently to use for it.  Doesn't sound like she is taking anything at home currently for the pain but she did get something in the office during her previous visit which the son doesn't know exactly what it was.        Past Medical History:  Diagnosis Date  . Acute blood loss anemia   . 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 Cough   . DVT (deep venous thrombosis) (HCC)   . Heart murmur   . Leukocytosis   . Painful orthopaedic hardware (HCC)   . Pressure ulcer     Patient Active Problem List   Diagnosis Date Noted  . DVT (deep venous thrombosis) (HCC) 04/30/2015  . Acute blood loss anemia 04/29/2015  . 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 Pressure ulcer 04/28/2015  . Painful orthopaedic hardware (HCC) 04/28/2015    Past Surgical History:  Procedure Laterality Date  .  HIP ARTHROPLASTY Left 04/28/2015   Procedure: ARTHROPLASTY BIPOLAR HIP (HEMIARTHROPLASTY  WITH HARDWARE REMOVAL, ;  Surgeon: Jodi Geralds, MD;  Location: [facility] OR;  Service: Orthopedics;  Laterality: Left;  . HIP SURGERY       OB History   No obstetric history on file.     No family history on file.  Social History   Tobacco Use  . Smoking status: Never Smoker  . Smokeless tobacco: Never Used  Substance Use Topics  . Alcohol use: No  . Drug use: No    Home Medications Prior to Admission medications   Medication Sig Start Date End Date Taking? Authorizing Provider  traMADol (ULTRAM) 50 MG tablet Take 1 tablet (50 mg total) by mouth every 6 (six) hours as needed. 03/13/20  Yes Rolan Bucco, MD  acetaminophen (TYLENOL) 500 MG tablet Take 2 tablets (1,000 mg  total) by mouth every 6 (six) hours as needed for moderate pain. 03/22/17   Shaune Pollack, MD  apixaban (ELIQUIS) 5 MG TABS tablet Take 5 mg by mouth 2 (two) times daily.    [provider]  baclofen (LIORESAL) 10 MG tablet Take 1 tablet (10 mg total) by mouth every 8 (eight) hours as needed for muscle spasms. 04/28/15   Marshia Ly, PA-C  bisacodyl (DULCOLAX) 5 MG EC tablet Take 1 tablet (5 mg total) by mouth daily as needed for moderate constipation. 05/01/15   Leatha Gilding, MD  famotidine (PEPCID) 20 MG tablet Take 1 tablet (20 mg total) by mouth 2 (two) times daily. 04/16/18   Rolan Bucco, MD  ferrous sulfate 325 (65 FE) MG tablet Take 1 tablet (325 mg total) by mouth daily with breakfast. 05/01/15   Leatha Gilding, MD  HYDROcodone-acetaminophen (NORCO/VICODIN) 5-325 MG tablet Take 1-2 tablets by mouth every 6 (six) hours as needed for moderate pain. 04/28/15   Marshia Ly, PA-C  Multiple Vitamins-Minerals (DECUBI-VITE) CAPS Take 1 capsule by mouth daily.    [provider]  pantoprazole (PROTONIX) 40 MG tablet Take 1 tablet (40 mg total) by mouth daily. 03/08/18   Pricilla Loveless, MD  polyethylene glycol (MIRALAX / Ethelene Hal) packet Take 17 g by mouth 2 (two) times daily.    [provider]  potassium chloride SA (K-DUR,KLOR-CON) 20 MEQ tablet Take 1 tablet (20 mEq total) by mouth daily. 03/08/18   Pricilla Loveless, MD  Protein (PROCEL) POWD Take 2 scoop by mouth 2 (two) times daily.    [provider]  sennosides-docusate sodium (SENOKOT-S) 8.6-50 MG tablet Take 2 tablets by mouth 2 (two) times daily.    [provider]  sucralfate (CARAFATE) 1 g tablet Take 1 tablet (1 g total) by mouth 4 (four) times daily -  with meals and at bedtime for 10 days. 03/08/18 03/18/18  Pricilla Loveless, MD    Allergies    Patient has no known allergies.  Review of Systems   Review of Systems  Constitutional: Negative for chills, diaphoresis, fatigue and  fever.  HENT: Negative for congestion, rhinorrhea and sneezing.   Eyes: Negative.   Respiratory: Negative for cough, chest tightness and shortness of breath.   Cardiovascular: Negative for chest pain and leg swelling.  Gastrointestinal: Negative for abdominal pain, blood in stool, diarrhea, nausea and vomiting.  Genitourinary: Negative for difficulty urinating, flank pain, frequency and hematuria.  Musculoskeletal: Positive for neck pain. Negative for arthralgias and back pain.  Skin: Negative for rash.  Neurological: Positive for numbness and headaches. Negative for dizziness, speech difficulty and  weakness.    Physical Exam Updated Vital Signs BP (!) 183/83   Pulse 83   Temp 98.6 F (37 C) (Oral)   Resp 17   Ht 5' (1.524 m)   Wt 45.4 kg   SpO2 98%   BMI 19.53 kg/m   Physical Exam Constitutional:      Appearance: She is well-developed and well-nourished.  HENT:     Head: Normocephalic and atraumatic.  Eyes:     Pupils: Pupils are equal, round, and reactive to light.  Neck:     Comments: Positive tenderness throughout the cervical spine and the musculature bilaterally.  No step-offs or deformities.  No skin lesions are noted.  No stiffness is noted. Cardiovascular:     Rate and Rhythm: Normal rate and regular rhythm.     Heart sounds: Normal heart sounds.  Pulmonary:     Effort: Pulmonary effort is normal. No respiratory distress.     Breath sounds: Normal breath sounds. No wheezing or rales.  Chest:     Chest wall: No tenderness.  Abdominal:     General: Bowel sounds are normal.     Palpations: Abdomen is soft.     Tenderness: There is no abdominal tenderness. There is no guarding or rebound.  Musculoskeletal:        General: No edema. Normal range of motion.     Cervical back: Normal range of motion and neck supple.  Lymphadenopathy:     Cervical: No cervical adenopathy.  Skin:    General: Skin is warm and dry.     Findings: No rash.  Neurological:     Mental  Status: She is alert and oriented to person, place, and time.     Comments: Motor 5 out of 5 all extremities, sensation grossly intact to light touch all extremities, radial pulses are intact  Psychiatric:        Mood and Affect: Mood and affect normal.     ED Results / Procedures / Treatments   Labs (all labs ordered are listed, but only abnormal results are displayed) Labs Reviewed  CBC - Abnormal; Notable for the following components:      Result Value   Platelets 406 (*)    All other components within normal limits  BASIC METABOLIC PANEL    EKG EKG Interpretation  Date/Time:  Tuesday March 13 2020 14:08:07 EST Ventricular Rate:  81 PR Interval:  154 QRS Duration: 94 QT Interval:  406 QTC Calculation: 471 R Axis:   20 Text Interpretation: Normal sinus rhythm Right atrial enlargement Borderline ECG Confirmed by Margarita Grizzle [phone]) on 03/13/2020 2:44:04 PM   Radiology CT Head Wo Contrast  Result Date: 03/13/2020 CLINICAL DATA:  Worsening headache. EXAM: CT HEAD WITHOUT CONTRAST CT CERVICAL SPINE WITHOUT CONTRAST TECHNIQUE: Multidetector CT imaging of the head and cervical spine was performed following the standard protocol without intravenous contrast. Multiplanar CT image reconstructions of the cervical spine were also generated. COMPARISON:  01/05/2011 FINDINGS: CT HEAD FINDINGS Brain: Age related atrophy. No focal abnormality affects the brainstem or cerebellum. Cerebral hemispheres show extensive chronic small-vessel ischemic changes of the white matter, progressive since 20/12. No large vessel territory infarction. No mass, hemorrhage, hydrocephalus or extra-axial collection. Vascular: There is atherosclerotic calcification of the major vessels at the base of the brain. Skull: Negative Sinuses/Orbits: Clear/normal Other: None CT CERVICAL SPINE FINDINGS Alignment: Normal Skull base and vertebrae: No regional fracture. Soft tissues and spinal canal: No significant soft  tissue lesion seen in the region.  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 Mild encroachment upon the foramen magnum but without neural compression. C2-3: Mild spondylosis and facet arthritis.  No significant disease. C3-4: Spondylosis and facet arthritis. Foraminal narrowing on the left. C4-5: Spondylosis.  Mild bilateral foraminal narrowing. C5-6: Spondylosis.  Mild bilateral foraminal narrowing. C6-7: Spondylosis.  Mild bilateral foraminal narrowing. C7-T1: Minimal uncovertebral degeneration and facet hypertrophy. Mild right foraminal narrowing on the right. Upper chest: Extensive pleural and parenchymal scarring, not completely evaluated. Other: None IMPRESSION: HEAD CT: No acute finding. Extensive chronic small-vessel ischemic changes of the cerebral hemispheric white matter, progressive since 2012. CERVICAL SPINE CT: No acute or traumatic finding. Chronic degenerative changes as outlined above. 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 Electronically Signed   By: Paulina FusiMark  Shogry M.D.   On: 03/13/2020 18:47   CT Cervical Spine Wo Contrast  Result Date: 03/13/2020 CLINICAL DATA:  Worsening headache. EXAM: CT HEAD WITHOUT CONTRAST CT CERVICAL SPINE WITHOUT CONTRAST TECHNIQUE: Multidetector CT imaging of the head and cervical spine was performed following the standard protocol without intravenous contrast. Multiplanar CT image reconstructions of the cervical spine were also generated. COMPARISON:  01/05/2011 FINDINGS: CT HEAD FINDINGS Brain: Age related atrophy. No focal abnormality affects the brainstem or cerebellum. Cerebral hemispheres show extensive chronic small-vessel ischemic changes of the white matter, progressive since 20/12. No large vessel territory infarction. No mass, hemorrhage, hydrocephalus or extra-axial collection. Vascular: There is atherosclerotic calcification of the major vessels at  the base of the brain. Skull: Negative Sinuses/Orbits: Clear/normal Other: None CT CERVICAL SPINE FINDINGS Alignment: Normal Skull base and vertebrae: No regional fracture. Soft tissues and spinal canal: No significant soft tissue lesion seen in the region. 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 Mild encroachment upon the foramen magnum but without neural compression. C2-3: Mild spondylosis and facet arthritis.  No significant disease. C3-4: Spondylosis and facet arthritis. Foraminal narrowing on the left. C4-5: Spondylosis.  Mild bilateral foraminal narrowing. C5-6: Spondylosis.  Mild bilateral foraminal narrowing. C6-7: Spondylosis.  Mild bilateral foraminal narrowing. C7-T1: Minimal uncovertebral degeneration and facet hypertrophy. Mild right foraminal narrowing on the right. Upper chest: Extensive pleural and parenchymal scarring, not completely evaluated. Other: None IMPRESSION: HEAD CT: No acute finding. Extensive chronic small-vessel ischemic changes of the cerebral hemispheric white matter, progressive since 2012. CERVICAL SPINE CT: No acute or traumatic finding. Chronic degenerative changes as outlined above. 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 Electronically Signed   By: Paulina FusiMark  Shogry M.D.   On: 03/13/2020 18:47    Procedures Procedures (including critical care time)  Medications Ordered in ED Medications  traMADol (ULTRAM) tablet 50 mg (50 mg Oral Given 03/13/20 1934)    ED Course  I have reviewed the triage vital signs and the nursing notes.  Pertinent labs & imaging results that were available during my care of the patient were reviewed by me and considered in my medical decision making (see chart for details).    MDM Rules/Calculators/A&P                          It was very challenging to get a history from the patient but it sounds as if patient has had neck pain for  the last few months which is gradually been getting worse.  She reported some numbness and  possibly weakness in the arms with some radicular symptoms.  I don't appreciate any neurologic deficits on exam.  She has good strength to the upper extremities.  Given that she is on Eliquis and her history was a little uncertain, she had a CT scan of her head and cervical spine which showed no acute abnormalities.  I feel this is musculoskeletal in nature.  Her headache is likely caused from the neck pain.  I will give her a short course of tramadol which seemed to help her while she was in the ED.  I encouraged the son to have her follow-up with her PCP for recheck.  She may need an MRI at some point if her symptoms aren't improving.  She is afebrile and no reported recent fevers I don't see any suggestions of infection. Final Clinical Impression(s) / ED Diagnoses Final diagnoses:  Acute nonintractable headache, unspecified headache type  Neck pain    Rx / DC Orders ED Discharge Orders         Ordered    traMADol (ULTRAM) 50 MG tablet  Every 6 hours PRN        03/13/20 2003           Rolan Bucco, MD 03/13/20 2011

## 2020-03-13 NOTE — ED Triage Notes (Signed)
Pts family member helps to provide history, reports pt has posterior headache and HTN that has been ongoing since November. States that patient is on bp meds and has seen her doctor for the same without much change. Reports pt is a/ox and ambulatory, denies any confusion.

## 2020-03-21 ENCOUNTER — Observation Stay (HOSPITAL_COMMUNITY)
Admission: EM | Admit: 2020-03-21 | Discharge: 2020-03-22 | Disposition: A | Discharge status: Home / Self Care | Payer: Medicare Other | Attending: Family Medicine | Admitting: Family Medicine

## 2020-03-21 ENCOUNTER — Encounter (HOSPITAL_COMMUNITY): Payer: Self-pay | Admitting: Emergency Medicine

## 2020-03-21 ENCOUNTER — Emergency Department (HOSPITAL_COMMUNITY): Payer: Medicare Other

## 2020-03-21 DIAGNOSIS — R778 Other specified abnormalities of plasma proteins: Secondary | ICD-10-CM | POA: Diagnosis not present

## 2020-03-21 DIAGNOSIS — Z96642 Presence of left artificial hip joint: Secondary | ICD-10-CM | POA: Diagnosis not present

## 2020-03-21 DIAGNOSIS — U071 COVID-19: Secondary | ICD-10-CM | POA: Diagnosis not present

## 2020-03-21 DIAGNOSIS — I10 Essential (primary) hypertension: Secondary | ICD-10-CM | POA: Diagnosis not present

## 2020-03-21 DIAGNOSIS — Z7901 Long term (current) use of anticoagulants: Secondary | ICD-10-CM | POA: Diagnosis not present

## 2020-03-21 DIAGNOSIS — I160 Hypertensive urgency: Secondary | ICD-10-CM

## 2020-03-21 DIAGNOSIS — R059 Cough, unspecified: Secondary | ICD-10-CM | POA: Diagnosis present

## 2020-03-21 DIAGNOSIS — Z86718 Personal history of other venous thrombosis and embolism: Secondary | ICD-10-CM | POA: Diagnosis not present

## 2020-03-21 DIAGNOSIS — Z79899 Other long term (current) drug therapy: Secondary | ICD-10-CM | POA: Diagnosis not present

## 2020-03-21 LAB — CBC
HCT: 38.1 % (ref 36.0–46.0)
Hemoglobin: 13.2 g/dL (ref 12.0–15.0)
MCH: 30.6 pg (ref 26.0–34.0)
MCHC: 34.6 g/dL (ref 30.0–36.0)
MCV: 88.4 fL (ref 80.0–100.0)
Platelets: 382 10*3/uL (ref 150–400)
RBC: 4.31 MIL/uL (ref 3.87–5.11)
RDW: 14.9 % (ref 11.5–15.5)
WBC: 8.3 10*3/uL (ref 4.0–10.5)
nRBC: 0 % (ref 0.0–0.2)

## 2020-03-21 LAB — TROPONIN I (HIGH SENSITIVITY)
Troponin I (High Sensitivity): 20 ng/L — ABNORMAL HIGH (ref <18)
Troponin I (High Sensitivity): 20 ng/L — ABNORMAL HIGH (ref <18)

## 2020-03-21 LAB — BASIC METABOLIC PANEL
Anion gap: 13 (ref 5–15)
BUN: 17 mg/dL (ref 8–23)
CO2: 26 mmol/L (ref 22–32)
Calcium: 9.5 mg/dL (ref 8.9–10.3)
Chloride: 95 mmol/L — ABNORMAL LOW (ref 98–111)
Creatinine, Ser: 1.02 mg/dL — ABNORMAL HIGH (ref 0.44–1.00)
GFR, Estimated: 54 mL/min — ABNORMAL LOW (ref >60)
Glucose, Bld: 93 mg/dL (ref 70–99)
Potassium: 3.3 mmol/L — ABNORMAL LOW (ref 3.5–5.1)
Sodium: 134 mmol/L — ABNORMAL LOW (ref 135–145)

## 2020-03-21 NOTE — ED Triage Notes (Signed)
Pt arrives to ED with chief complaint cough with shortness of breath for 2 days. She lives with family who is covid + she has not been tested.

## 2020-03-21 NOTE — ED Provider Notes (Addendum)
[facility] EMERGENCY DEPARTMENT Provider Note   CSN: [id number] Arrival date & time: 03/21/20  1122     History Chief Complaint  Patient presents with  . Cough    Shortness of breath     Dnasia Amber Fisher is a 87 y.o. female.  HPI     This is an 87 year old female with history of DVT associated with hip fracture who presents with cough and concerns for Fisher. Patient reports that her entire family has tested positive for Fisher. When asked what made her present to the emergency room today she states "they wanted me to get tested." She does report she has had some dry cough. No significant shortness of breath, chest pain, abdominal pain, nausea, vomiting. She has had one Fisher vaccination. She reports her son tested positive at home.  History taken through [place] interpreter  Past Medical History:  Diagnosis Date  . Acute blood loss anemia   . 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 Cough   . DVT (deep venous thrombosis) (HCC)   . Heart murmur   . Leukocytosis   . Painful orthopaedic hardware (HCC)   . Pressure ulcer     Patient Active Problem List   Diagnosis Date Noted  . DVT (deep venous thrombosis) (HCC) 04/30/2015  . Acute blood loss anemia 04/29/2015  . 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 Pressure ulcer 04/28/2015  . Painful orthopaedic hardware (HCC) 04/28/2015    Past Surgical History:  Procedure Laterality Date  . HIP ARTHROPLASTY Left 04/28/2015   Procedure: ARTHROPLASTY BIPOLAR HIP (HEMIARTHROPLASTY  WITH HARDWARE REMOVAL, ;  Surgeon: Jodi Geralds, MD;  Location: [facility] OR;  Service: Orthopedics;  Laterality: Left;  . HIP SURGERY       OB History   No obstetric history on file.     No family history on file.  Social History   Tobacco Use  . Smoking status: Never Smoker  . Smokeless tobacco: Never Used  Substance Use Topics  . Alcohol use: No  . Drug use: No    Home Medications Prior to Admission  medications   Medication Sig Start Date End Date Taking? Authorizing Provider  acetaminophen (TYLENOL) 500 MG tablet Take 2 tablets (1,000 mg total) by mouth every 6 (six) hours as needed for moderate pain. 03/22/17   Shaune Pollack, MD  apixaban (ELIQUIS) 5 MG TABS tablet Take 5 mg by mouth 2 (two) times daily.    [provider]  baclofen (LIORESAL) 10 MG tablet Take 1 tablet (10 mg total) by mouth every 8 (eight) hours as needed for muscle spasms. 04/28/15   Marshia Ly, PA-C  bisacodyl (DULCOLAX) 5 MG EC tablet Take 1 tablet (5 mg total) by mouth daily as needed for moderate constipation. 05/01/15   Leatha Gilding, MD  famotidine (PEPCID) 20 MG tablet Take 1 tablet (20 mg total) by mouth 2 (two) times daily. 04/16/18   Rolan Bucco, MD  ferrous sulfate 325 (65 FE) MG tablet Take 1 tablet (325 mg total) by mouth daily with breakfast. 05/01/15   Leatha Gilding, MD  HYDROcodone-acetaminophen (NORCO/VICODIN) 5-325 MG tablet Take 1-2 tablets by mouth every 6 (six) hours as needed for moderate pain. 04/28/15   Marshia Ly, PA-C  Multiple Vitamins-Minerals (DECUBI-VITE) CAPS Take 1 capsule by mouth daily.    [provider]  pantoprazole (PROTONIX) 40 MG tablet Take 1 tablet (40 mg total) by mouth daily. 03/08/18   Pricilla Loveless, MD  polyethylene glycol (MIRALAX / GLYCOLAX) packet Take 17 g by mouth 2 (two) times daily.    [provider]  potassium chloride SA (K-DUR,KLOR-CON) 20 MEQ tablet Take 1 tablet (20 mEq total) by mouth daily. 03/08/18   Sherwood Gambler, MD  Protein (PROCEL) POWD Take 2 scoop by mouth 2 (two) times daily.    [provider]  sennosides-docusate sodium (SENOKOT-S) 8.6-50 MG tablet Take 2 tablets by mouth 2 (two) times daily.    [provider]  sucralfate (CARAFATE) 1 g tablet Take 1 tablet (1 g total) by mouth 4 (four) times daily -  with meals and at bedtime for 10 days. 03/08/18 03/18/18  Sherwood Gambler, MD  traMADol  (ULTRAM) 50 MG tablet Take 1 tablet (50 mg total) by mouth every 6 (six) hours as needed. 03/13/20   Malvin Johns, MD    Allergies    Patient has no known allergies.  Review of Systems   Review of Systems  Constitutional: Negative for chills and fever.  Respiratory: Positive for cough. Negative for shortness of breath.   Cardiovascular: Negative for chest pain.  Gastrointestinal: Negative for abdominal pain, nausea and vomiting.  Genitourinary: Negative for dysuria.  All other systems reviewed and are negative.   Physical Exam Updated Vital Signs BP (!) 177/77   Pulse 86   Temp 99.5 F (37.5 C) (Oral)   Resp 18   SpO2 95%   Physical Exam Vitals and nursing note reviewed.  Constitutional:      Appearance: She is well-developed and well-nourished.     Comments: Elderly, chronically ill-appearing, no acute distress  HENT:     Head: Normocephalic and atraumatic.     Mouth/Throat:     Mouth: Mucous membranes are dry.  Eyes:     Pupils: Pupils are equal, round, and reactive to light.  Cardiovascular:     Rate and Rhythm: Normal rate and regular rhythm.     Heart sounds: Murmur heard.    Pulmonary:     Effort: Pulmonary effort is normal. No respiratory distress.     Breath sounds: No wheezing.  Abdominal:     General: Bowel sounds are normal.     Palpations: Abdomen is soft.     Tenderness: There is no guarding or rebound.  Musculoskeletal:     Cervical back: Neck supple.     Right lower leg: No edema.     Left lower leg: No edema.  Skin:    General: Skin is warm and dry.  Neurological:     Mental Status: She is alert and oriented to person, place, and time.  Psychiatric:        Mood and Affect: Mood and affect and mood normal.     ED Results / Procedures / Treatments   Labs (all labs ordered are listed, but only abnormal results are displayed) Labs Reviewed  RESP PANEL BY RT-PCR (FLU A&B, COVID) ARPGX2 - Abnormal; Notable for the following components:       Result Value   SARS Coronavirus 2 by RT PCR POSITIVE (*)    All other components within normal limits  BASIC METABOLIC PANEL - Abnormal; Notable for the following components:   Sodium 134 (*)    Potassium 3.3 (*)    Chloride 95 (*)    Creatinine, Ser 1.02 (*)    GFR, Estimated 54 (*)    All other components within normal limits  TROPONIN I (HIGH SENSITIVITY) - Abnormal; Notable for the following components:  Troponin I (High Sensitivity) 20 (*)    All other components within normal limits  TROPONIN I (HIGH SENSITIVITY) - Abnormal; Notable for the following components:   Troponin I (High Sensitivity) 20 (*)    All other components within normal limits  TROPONIN I (HIGH SENSITIVITY) - Abnormal; Notable for the following components:   Troponin I (High Sensitivity) 29 (*)    All other components within normal limits  CBC  TROPONIN I (HIGH SENSITIVITY)    EKG EKG Interpretation  Date/Time:  Wednesday March 21 2020 12:56:14 EST Ventricular Rate:  91 PR Interval:  146 QRS Duration: 96 QT Interval:  384 QTC Calculation: 472 R Axis:   22 Text Interpretation: Normal sinus rhythm Possible Anterior infarct , age undetermined Abnormal ECG Confirmed by Ross Marcus ([id number]) on 03/21/2020 11:03:12 PM   Radiology DG Chest Port 1 View  Result Date: 03/21/2020 CLINICAL DATA:  Cough, shortness of breath. EXAM: PORTABLE CHEST 1 VIEW COMPARISON:  April 29, 2015. FINDINGS: The heart size and mediastinal contours are within normal limits. Both lungs are clear. No pneumothorax or pleural effusion is noted. The visualized skeletal structures are unremarkable. IMPRESSION: No active disease. Aortic Atherosclerosis (ICD10-I70.0). Electronically Signed   By: Lupita Raider M.D.   On: 03/21/2020 13:16    Procedures Procedures (including critical care time)  Medications Ordered in ED Medications  hydrALAZINE (APRESOLINE) injection 10 mg (has no administration in time range)    ED Course   I have reviewed the triage vital signs and the nursing notes.  Pertinent labs & imaging results that were available during my care of the patient were reviewed by me and considered in my medical decision making (see chart for details).    MDM Rules/Calculators/A&P                          Patient presents with cough. Known positive Fisher exposures. She is interested in Fisher testing. Aside from cough, she has no other specific complaints. Vital signs notable for blood pressure of 198/83. She has no documented history of high blood pressure. She denies chest pain or shortness of breath at this time. Labs reviewed from triage. Troponin 20 and stable on repeat. EKG without acute ischemic or arrhythmic changes. She is without chest pain. Unclear why a troponin was sent. Will repeat for stability. Doubt ACS. From a Fisher standpoint, she is clinically stable and without significant hypoxia. Chest x-ray is clear without pneumonia, pneumothorax, viral changes. Unfortunately, Fisher testing has not resulted. Will investigate.  Patient tested positive for Fisher.  Serial troponins was slightly up trended from 20 to 20-29.  She is notably hypertensive into the 200s systolic.  No noted history of high blood pressure and not on any blood pressure medication.  She was given a dose of IV hydralazine.  Suspect mild hypertensive urgency.  Given elevated troponin, feel she warrants admission for further trending and blood pressure control.  Will likely need an echocardiogram as well.  Again, do not feel this is consistent with ACS.  Final Clinical Impression(s) / ED Diagnoses Final diagnoses:  Hypertensive urgency  Elevated troponin  Fisher    Rx / DC Orders ED Discharge Orders    None       Lesslie Mckeehan, Mayer Masker, MD 03/22/20 [id number]    Shon Baton, MD 03/22/20 [phone]

## 2020-03-21 NOTE — ED Notes (Signed)
RN made aware of BP 

## 2020-03-21 NOTE — ED Notes (Signed)
Son, Tilford Pillar, requests a call re: patient status and/or discharge status. He is the only contact on her registration. His number: [phone]

## 2020-03-21 NOTE — ED Notes (Signed)
Pt's Son stated He has known Positive test and everyone they live with have a known Positive Test.

## 2020-03-22 ENCOUNTER — Observation Stay (HOSPITAL_BASED_OUTPATIENT_CLINIC_OR_DEPARTMENT_OTHER): Payer: Medicare Other

## 2020-03-22 DIAGNOSIS — I10 Essential (primary) hypertension: Secondary | ICD-10-CM | POA: Diagnosis present

## 2020-03-22 DIAGNOSIS — R011 Cardiac murmur, unspecified: Secondary | ICD-10-CM | POA: Diagnosis not present

## 2020-03-22 DIAGNOSIS — R778 Other specified abnormalities of plasma proteins: Secondary | ICD-10-CM | POA: Diagnosis not present

## 2020-03-22 DIAGNOSIS — U071 COVID-19: Secondary | ICD-10-CM | POA: Diagnosis not present

## 2020-03-22 DIAGNOSIS — Z86718 Personal history of other venous thrombosis and embolism: Secondary | ICD-10-CM | POA: Diagnosis not present

## 2020-03-22 LAB — RESP PANEL BY RT-PCR (FLU A&B, COVID) ARPGX2
Influenza A by PCR: NEGATIVE
Influenza B by PCR: NEGATIVE
SARS Coronavirus 2 by RT PCR: POSITIVE — AB

## 2020-03-22 LAB — TROPONIN I (HIGH SENSITIVITY): Troponin I (High Sensitivity): 29 ng/L — ABNORMAL HIGH (ref <18)

## 2020-03-22 LAB — ECHOCARDIOGRAM LIMITED
Area-P 1/2: 4.29 cm2
S' Lateral: 1.8 cm

## 2020-03-22 MED ORDER — VITAMIN C-ROSE HIPS 500 MG PO TABS: 500.0000 mg | ORAL_TABLET | Freq: Every day | ORAL | Status: AC

## 2020-03-22 MED ORDER — ACETAMINOPHEN 650 MG RE SUPP: 650.0000 mg | Freq: Four times a day (QID) | RECTAL | Status: DC | PRN

## 2020-03-22 MED ORDER — POTASSIUM CHLORIDE CRYS ER 20 MEQ PO TBCR
20.0000 meq | EXTENDED_RELEASE_TABLET | Freq: Once | ORAL | Status: AC
  Administered 2020-03-22: 20 meq via ORAL
  Filled 2020-03-22: qty 1

## 2020-03-22 MED ORDER — PREDNISONE 20 MG PO TABS: 40.0000 mg | ORAL_TABLET | Freq: Every day | ORAL | Status: DC

## 2020-03-22 MED ORDER — ZINC SULFATE 220 (50 ZN) MG PO TABS: 220.0000 mg | ORAL_TABLET | Freq: Every day | ORAL | 0 refills | Status: AC

## 2020-03-22 MED ORDER — ASPIRIN 81 MG PO CHEW: 324.0000 mg | CHEWABLE_TABLET | Freq: Once | ORAL | Status: DC

## 2020-03-22 MED ORDER — HYDRALAZINE HCL 20 MG/ML IJ SOLN
10.0000 mg | INTRAMUSCULAR | Status: DC | PRN
  Administered 2020-03-22: 10 mg via INTRAVENOUS
  Filled 2020-03-22: qty 1

## 2020-03-22 MED ORDER — METHYLPREDNISOLONE SODIUM SUCC 40 MG IJ SOLR
20.0000 mg | Freq: Two times a day (BID) | INTRAMUSCULAR | Status: DC
Start: 2020-03-22 — End: 2020-03-22
  Administered 2020-03-22: 20 mg via INTRAVENOUS
  Filled 2020-03-22: qty 1

## 2020-03-22 MED ORDER — HYDRALAZINE HCL 20 MG/ML IJ SOLN: 10.0000 mg | Freq: Once | INTRAMUSCULAR | Status: DC

## 2020-03-22 MED ORDER — SODIUM CHLORIDE 0.9 % IV SOLN
200.0000 mg | Freq: Once | INTRAVENOUS | Status: AC
  Administered 2020-03-22: 200 mg via INTRAVENOUS
  Filled 2020-03-22: qty 40

## 2020-03-22 MED ORDER — ONDANSETRON HCL 4 MG/2ML IJ SOLN: 4.0000 mg | Freq: Four times a day (QID) | INTRAMUSCULAR | Status: DC | PRN

## 2020-03-22 MED ORDER — AMLODIPINE BESYLATE 5 MG PO TABS
5.0000 mg | ORAL_TABLET | Freq: Every day | ORAL | Status: DC
Start: 2020-03-22 — End: 2020-03-22

## 2020-03-22 MED ORDER — SODIUM CHLORIDE 0.9 % IV SOLN: 100.0000 mg | Freq: Every day | INTRAVENOUS | Status: DC

## 2020-03-22 MED ORDER — ACETAMINOPHEN 325 MG PO TABS
650.0000 mg | ORAL_TABLET | Freq: Four times a day (QID) | ORAL | Status: DC | PRN
  Administered 2020-03-22: 650 mg via ORAL
  Filled 2020-03-22: qty 2

## 2020-03-22 MED ORDER — ONDANSETRON HCL 4 MG PO TABS: 4.0000 mg | ORAL_TABLET | Freq: Four times a day (QID) | ORAL | Status: DC | PRN

## 2020-03-22 MED ORDER — ENOXAPARIN SODIUM 30 MG/0.3ML ~~LOC~~ SOLN
30.0000 mg | SUBCUTANEOUS | Status: DC
  Filled 2020-03-22: qty 0.3

## 2020-03-22 NOTE — Discharge Summary (Addendum)
Physician Discharge Summary  Amber Fisher QQV:[id number] DOB: 24-Mar-1933 DOA: 03/21/2020  PCP: Rometta Emery, MD  Admit date: 03/21/2020 Discharge date: 03/22/2020  Recommendations for Outpatient Follow-up:  1. F/u COVID infection   Follow-up Information    Rometta Emery, MD. Schedule an appointment as soon as possible for a visit in 1 week(s).   Specialty: Internal Medicine Contact information: [address] [place] [address] [phone]                Discharge Diagnoses: Principal diagnosis is #1 Principal Problem:   COVID-19 virus infection Active Problems:   HTN (hypertension)   History of DVT (deep vein thrombosis)   Discharge Condition: improved Disposition: home  Diet recommendation:  Diet Orders (From admission, onward)    Start     Ordered   03/22/20 0425  Diet Heart Room service appropriate? Yes; Fluid consistency: Thin  Diet effective now       Question Answer Comment  Room service appropriate? Yes   Fluid consistency: Thin      03/22/20 0424   03/22/20 0000  Diet - low sodium heart healthy        03/22/20 1222          HPI/Hospital Course:   87 year old woman PMH provoked DVT after hip fracture, essential hypertension, presented with cough, found to be Covid positive and hypertensive.  Placed in observation given advanced age started on remdesivir, steroid.  Asymptomatic, no hypoxia, discharged home.  I discussed in detail with her son by telephone.  I examined the patient and took a history using hospital provided iPad interpreter.  COVID-19 positive with cough --No hypoxia.  No inflammatory markers checked on admission.  Chest x-ray negative per radiologist. --Given lack of hypoxia or symptoms, and current clinical status, will discharge home, no need or indication for steroids or remdesivir in the hospital setting.  Will refer to the outpatient infusion center for consideration of monoclonal antibodies or other treatments based  on risk factors.  Essential hypertension, uncontrolled --Control improved, continue amlodipine  Modestly elevated troponin, trivial, no further evaluation suggested --Follow-up 2D echocardiogram ordered on admission, however given lack of symptoms and reassuring EKG, think this can be followed in the outpatient setting and patient does not need to wait for the result. --Not clear why this lab was obtained, apparently was obtained in triage, no signs or symptoms to suggest ACS.  PMH DVT provoked after hip fracture 2017 --continue apixaban  Today's assessment: S: CC: f/u cough  Feels better, just cough. Breathing fine, no pain.  O: Vitals:  Vitals:   03/22/20 0900 03/22/20 1030  BP: (!) 172/91 (!) 151/83  Pulse: (!) 105 (!) 107  Resp: (!) 23 (!) 25  Temp:    SpO2: 94% 97%    Constitutional:  . Appears calm and comfortable Respiratory:  . CTA bilaterally, no w/r/r.  . Respiratory effort normal.  Cardiovascular:  . RRR, no m/r/g . No LE extremity edema   Psychiatric:  . Mental status o Mood, affect appropriate  chest x-ray no acute disease, labs unremarkable on admission  Discharge Instructions  Discharge Instructions    Diet - low sodium heart healthy   Complete by: As directed    Discharge instructions   Complete by: As directed    Call your physician or seek immediate medical attention for shortness of breath, pain, fever, or worsening condition. You have been referred to the outpatient COVID infusion center. They will contact you if any further treatment  is recommended, but for now, no new medications are needed on discharge other than recommendations for vitamin C and Zinc, both over the counter. Self-isolate until 1/17.   Increase activity slowly   Complete by: As directed      Allergies as of 03/22/2020   No Known Allergies     Medication List    TAKE these medications   amLODipine 5 MG tablet Commonly known as: NORVASC Take 5 mg by mouth daily.    apixaban 5 MG Tabs tablet Commonly known as: ELIQUIS Take 5 mg by mouth 2 (two) times daily.   hydrOXYzine 25 MG tablet Commonly known as: ATARAX/VISTARIL Take 25 mg by mouth daily.   omeprazole 40 MG capsule Commonly known as: PRILOSEC Take 40 mg by mouth daily.   traMADol 50 MG tablet Commonly known as: ULTRAM Take 1 tablet (50 mg total) by mouth every 6 (six) hours as needed.   vitamin C with rose hips 500 MG tablet Take 1 tablet (500 mg total) by mouth daily.   Zinc Sulfate 220 (50 Zn) MG Tabs Take 1 tablet (220 mg total) by mouth daily.      No Known Allergies  The results of significant diagnostics from this hospitalization (including imaging, microbiology, ancillary and laboratory) are listed below for reference.    Significant Diagnostic Studies:  DG Chest Port 1 View  Result Date: 03/21/2020 CLINICAL DATA:  Cough, shortness of breath. EXAM: PORTABLE CHEST 1 VIEW COMPARISON:  April 29, 2015. FINDINGS: The heart size and mediastinal contours are within normal limits. Both lungs are clear. No pneumothorax or pleural effusion is noted. The visualized skeletal structures are unremarkable. IMPRESSION: No active disease. Aortic Atherosclerosis (ICD10-I70.0). Electronically Signed   By: Marijo Conception M.D.   On: 03/21/2020 13:16    Microbiology: Recent Results (from the past 240 hour(s))  Resp Panel by RT-PCR (Flu A&B, Covid) Nasopharyngeal Swab     Status: Abnormal   Collection Time: 03/22/20 12:15 AM   Specimen: Nasopharyngeal Swab; Nasopharyngeal(NP) swabs in vial transport medium  Result Value Ref Range Status   SARS Coronavirus 2 by RT PCR POSITIVE (A) NEGATIVE Final    Comment: RESULT CALLED TO, READ BACK BY AND VERIFIED WITH: RN JOSHUA NEWTON AT 0226 BY MESSAN H. ON 03/22/2020 (NOTE) SARS-CoV-2 target nucleic acids are DETECTED.  The SARS-CoV-2 RNA is generally detectable in upper respiratory specimens during the acute phase of infection. Positive results  are indicative of the presence of the identified virus, but do not rule out bacterial infection or co-infection with other pathogens not detected by the test. Clinical correlation with patient history and other diagnostic information is necessary to determine patient infection status. The expected result is Negative.  Fact Sheet for Patients: EntrepreneurPulse.com.au  Fact Sheet for Healthcare Providers: IncredibleEmployment.be  This test is not yet approved or cleared by the [place] FDA and  has been authorized for detection and/or diagnosis of SARS-CoV-2 by FDA under an Emergency Use Authorization (EUA).  This EUA will remain in effect (meaning  this test can be used) for the duration of  the COVID-19 declaration under Section 564(b)(1) of the Act, 21 U.S.C. section 360bbb-3(b)(1), unless the authorization is terminated or revoked sooner.     Influenza A by PCR NEGATIVE NEGATIVE Final   Influenza B by PCR NEGATIVE NEGATIVE Final    Comment: (NOTE) The Xpert Xpress SARS-CoV-2/FLU/RSV plus assay is intended as an aid in the diagnosis of influenza from Nasopharyngeal swab specimens and should not be  used as a sole basis for treatment. Nasal washings and aspirates are unacceptable for Xpert Xpress SARS-CoV-2/FLU/RSV testing.  Fact Sheet for Patients: BloggerCourse.com  Fact Sheet for Healthcare Providers: SeriousBroker.it  This test is not yet approved or cleared by the [place] FDA and has been authorized for detection and/or diagnosis of SARS-CoV-2 by FDA under an Emergency Use Authorization (EUA). This EUA will remain in effect (meaning this test can be used) for the duration of the COVID-19 declaration under Section 564(b)(1) of the Act, 21 U.S.C. section 360bbb-3(b)(1), unless the authorization is terminated or revoked.  Performed at [facility], [address],  [place], [place] [address]      Labs: Basic Metabolic Panel: Recent Labs  Lab 03/21/20 1309  NA 134*  K 3.3*  CL 95*  CO2 26  GLUCOSE 93  BUN 17  CREATININE 1.02*  CALCIUM 9.5   CBC: Recent Labs  Lab 03/21/20 1309  WBC 8.3  HGB 13.2  HCT 38.1  MCV 88.4  PLT 382    Principal Problem:   COVID-19 virus infection Active Problems:   HTN (hypertension)   History of DVT (deep vein thrombosis)   Time coordinating discharge: 35 minutes  Signed:  Brendia Sacks, MD  [facility]  03/22/2020, 12:26 PM

## 2020-03-22 NOTE — ED Notes (Signed)
Lunch Tray Ordered @ 1114.  

## 2020-03-22 NOTE — ED Notes (Signed)
Per IV team they wanted me to make sure before inserting anything IV that it worked correctly. At this time patient IV is placed correctly for RN to use.

## 2020-03-22 NOTE — H&P (Addendum)
History and Physical    Fernando Torry UJW:[id number] DOB: [birth year] DOA: 03/21/2020  PCP: Rometta Emery, MD  Patient coming from: Home  I have personally briefly reviewed patient's old medical records in [facility]  Chief Complaint: Cough, COVID  HPI: Amber Fisher is a 87 y.o. female with medical history significant of DVT associated with hip fx, HTN.  Pt presents to ED with c/o cough and concerns for COVID-19.  Pt family has tested positive for COVID-19.  Pt does have some dry cough.  Has had 1 covid vaccination.  Son has tested positive at home.  Symptoms are constant, persistent, nothing makes better or worse.  No significant SOB, CP, nausea, vomiting.   ED Course: COVID+  No O2 requirement.  BP running 170s.  Pt does have headache.  Did have this on 12/28 with HTN as well it seems, CT head neg at that time.  Trops were 20, 20, 29.   Review of Systems: As per HPI, otherwise all review of systems negative.  Past Medical History:  Diagnosis Date  . Acute blood loss anemia   . 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 Cough   . DVT (deep venous thrombosis) (HCC)   . Heart murmur   . Leukocytosis   . Painful orthopaedic hardware (HCC)   . Pressure ulcer     Past Surgical History:  Procedure Laterality Date  . HIP ARTHROPLASTY Left 04/28/2015   Procedure: ARTHROPLASTY BIPOLAR HIP (HEMIARTHROPLASTY  WITH HARDWARE REMOVAL, ;  Surgeon: Jodi Geralds, MD;  Location: [facility] OR;  Service: Orthopedics;  Laterality: Left;  . HIP SURGERY       reports that she has never smoked. She has never used smokeless tobacco. She reports that she does not drink alcohol and does not use drugs.  No Known Allergies  No family history on file. Son and rest of family are sick with COVID-19  Prior to Admission medications   Medication Sig Start Date End Date Taking? Authorizing Provider  acetaminophen (TYLENOL) 500 MG tablet Take 2 tablets (1,000 mg total) by mouth every 6  (six) hours as needed for moderate pain. 03/22/17   Shaune Pollack, MD  apixaban (ELIQUIS) 5 MG TABS tablet Take 5 mg by mouth 2 (two) times daily.    [provider]  baclofen (LIORESAL) 10 MG tablet Take 1 tablet (10 mg total) by mouth every 8 (eight) hours as needed for muscle spasms. 04/28/15   Marshia Ly, PA-C  bisacodyl (DULCOLAX) 5 MG EC tablet Take 1 tablet (5 mg total) by mouth daily as needed for moderate constipation. 05/01/15   Leatha Gilding, MD  famotidine (PEPCID) 20 MG tablet Take 1 tablet (20 mg total) by mouth 2 (two) times daily. 04/16/18   Rolan Bucco, MD  ferrous sulfate 325 (65 FE) MG tablet Take 1 tablet (325 mg total) by mouth daily with breakfast. 05/01/15   Leatha Gilding, MD  HYDROcodone-acetaminophen (NORCO/VICODIN) 5-325 MG tablet Take 1-2 tablets by mouth every 6 (six) hours as needed for moderate pain. 04/28/15   Marshia Ly, PA-C  Multiple Vitamins-Minerals (DECUBI-VITE) CAPS Take 1 capsule by mouth daily.    [provider]  pantoprazole (PROTONIX) 40 MG tablet Take 1 tablet (40 mg total) by mouth daily. 03/08/18   Pricilla Loveless, MD  polyethylene glycol (MIRALAX / Ethelene Hal) packet Take 17 g by mouth 2 (two) times daily.    [provider]  potassium chloride SA (K-DUR,KLOR-CON) 20 MEQ tablet Take  1 tablet (20 mEq total) by mouth daily. 03/08/18   Sherwood Gambler, MD  Protein (PROCEL) POWD Take 2 scoop by mouth 2 (two) times daily.    [provider]  sennosides-docusate sodium (SENOKOT-S) 8.6-50 MG tablet Take 2 tablets by mouth 2 (two) times daily.    [provider]  sucralfate (CARAFATE) 1 g tablet Take 1 tablet (1 g total) by mouth 4 (four) times daily -  with meals and at bedtime for 10 days. 03/08/18 03/18/18  Sherwood Gambler, MD  traMADol (ULTRAM) 50 MG tablet Take 1 tablet (50 mg total) by mouth every 6 (six) hours as needed. 03/13/20   Malvin Johns, MD    Physical Exam: Vitals:   03/22/20 0330  03/22/20 0345 03/22/20 0400 03/22/20 0451  BP: (!) 174/79 (!) 177/80 (!) 167/65 (!) 159/82  Pulse: 85 95 95 88  Resp: (!) 21 (!) 22 (!) 26 19  Temp:    97.9 F (36.6 C)  TempSrc:    Oral  SpO2: 95% 94% 98% 94%    Constitutional: NAD, calm, comfortable Eyes: PERRL, erythema to conjunctiva in L eye, doesn't appear to be effecting vision though per patient. ENMT: Mucous membranes are moist. Posterior pharynx clear of any exudate or lesions.Normal dentition.  Neck: normal, supple, no masses, no thyromegaly Respiratory: clear to auscultation bilaterally, no wheezing, no crackles. Normal respiratory effort. No accessory muscle use.  Cardiovascular: Murmur present Abdomen: no tenderness, no masses palpated. No hepatosplenomegaly. Bowel sounds positive.  Musculoskeletal: no clubbing / cyanosis. No joint deformity upper and lower extremities. Good ROM, no contractures. Normal muscle tone.  Skin: no rashes, lesions, ulcers. No induration Neurologic: CN 2-12 grossly intact. Sensation intact, DTR normal. Strength 5/5 in all 4.  Psychiatric: Normal judgment and insight. Alert and oriented x 3. Normal mood.    Labs on Admission: I have personally reviewed following labs and imaging studies  CBC: Recent Labs  Lab 03/21/20 1309  WBC 8.3  HGB 13.2  HCT 38.1  MCV 88.4  PLT 412   Basic Metabolic Panel: Recent Labs  Lab 03/21/20 1309  NA 134*  K 3.3*  CL 95*  CO2 26  GLUCOSE 93  BUN 17  CREATININE 1.02*  CALCIUM 9.5   GFR: Estimated Creatinine Clearance: 29.4 mL/min (A) (by C-G formula based on SCr of 1.02 mg/dL (H)). Liver Function Tests: No results for input(s): AST, ALT, ALKPHOS, BILITOT, PROT, ALBUMIN in the last 168 hours. No results for input(s): LIPASE, AMYLASE in the last 168 hours. No results for input(s): AMMONIA in the last 168 hours. Coagulation Profile: No results for input(s): INR, PROTIME in the last 168 hours. Cardiac Enzymes: No results for input(s): CKTOTAL,  CKMB, CKMBINDEX, TROPONINI in the last 168 hours. BNP (last 3 results) No results for input(s): PROBNP in the last 8760 hours. HbA1C: No results for input(s): HGBA1C in the last 72 hours. CBG: No results for input(s): GLUCAP in the last 168 hours. Lipid Profile: No results for input(s): CHOL, HDL, LDLCALC, TRIG, CHOLHDL, LDLDIRECT in the last 72 hours. Thyroid Function Tests: No results for input(s): TSH, T4TOTAL, FREET4, T3FREE, THYROIDAB in the last 72 hours. Anemia Panel: No results for input(s): VITAMINB12, FOLATE, FERRITIN, TIBC, IRON, RETICCTPCT in the last 72 hours. Urine analysis:    Component Value Date/Time   COLORURINE STRAW (A) 04/16/2018 1905   APPEARANCEUR CLEAR 04/16/2018 1905   LABSPEC 1.009 04/16/2018 1905   PHURINE 5.0 04/16/2018 1905   GLUCOSEU NEGATIVE 04/16/2018 1905   HGBUR SMALL (  A) 04/16/2018 1905   BILIRUBINUR NEGATIVE 04/16/2018 1905   KETONESUR NEGATIVE 04/16/2018 1905   PROTEINUR NEGATIVE 04/16/2018 1905   UROBILINOGEN 0.2 11/25/2010 1821   NITRITE NEGATIVE 04/16/2018 1905   LEUKOCYTESUR NEGATIVE 04/16/2018 1905    Radiological Exams on Admission: DG Chest Port 1 View  Result Date: 03/21/2020 CLINICAL DATA:  Cough, shortness of breath. EXAM: PORTABLE CHEST 1 VIEW COMPARISON:  April 29, 2015. FINDINGS: The heart size and mediastinal contours are within normal limits. Both lungs are clear. No pneumothorax or pleural effusion is noted. The visualized skeletal structures are unremarkable. IMPRESSION: No active disease. Aortic Atherosclerosis (ICD10-I70.0). Electronically Signed   By: Lupita Raider M.D.   On: 03/21/2020 13:16    EKG: Independently reviewed.  Assessment/Plan Principal Problem:   COVID-19 virus infection Active Problems:   HTN (hypertension)   History of DVT (deep vein thrombosis)    1. COVID-19 1. Pt at risk for worsening given age and HTN 2. No O2 requirement at this time. 3. Starting remdesivir given very low availability  of MAB effective against omicron (latest I heard was Joffre was getting about 7 doses per day!) 4. Probably okay to be discharged later today provided outpt infusions can be set up. 2. HTN - 1. Med rec pending 2. Sounds like she may be on a medication for HTN already at home, in which case continue this 3. PRN hydralazine ordered for now 4. Currently SBP 160s 5. Very wide pulse pressure and murmur present 6. 2d echo ordered, not sure if they will do this while COVID+ though. 3. Headache - 1. No neuro deficits 2. CT head was neg just a couple of days ago 3. Feel repeat CT head would be very low yield at this time. 4. Elevated troponin - 1. Very mildly elevated trop in setting of COVID-19 2. No CP 3. Wouldn't check further trops at this point. 5. H/o DVT - 1. Provoked in setting of hip fx in 2017 2. Sounds like pt may still be on eliquis for this chronically (see EDP note from just a couple of days ago where EDP documents that she is), med rec pending 3. Cont eliquis if so  DVT prophylaxis: TBD: lovenox vs cont eliquis if shes still on this (I think she may be) Code Status: Full Family Communication: Unable to get a-hold of son at number provided Disposition Plan: Home, possibly later today Consults called: None Admission status: Place in 65   Markis Langland M. DO [facility]  How to contact the [facility] Attending or Consulting provider 7A - 7P or covering provider during after hours 7P -7A, for this patient?  1. Check the care team in [facility] and look for a) attending/consulting [facility] provider listed and b) the [facility] team listed 2. Log into [email or web address]  Amion Physician Scheduling and messaging for groups and whole hospitals  On call and physician scheduling software for group practices, residents, hospitalists and other medical providers for call, clinic, rotation and shift schedules. On[employer] is a hospital-wide system for scheduling doctors and paging doctors on call.  EasyPlot is for scientific plotting and data analysis.  [email or web address]  and use [place]'s universal password to access. If you do not have the password, please contact the hospital operator.  3. Locate the [facility] provider you are looking for under [facility] and page to a number that you can be directly reached. 4. If you still have difficulty reaching the provider, please page the [facility] (Director on Call)  for the Hospitalists listed on amion for assistance.  03/22/2020, 4:55 AM

## 2020-03-22 NOTE — Progress Notes (Signed)
  Echocardiogram 2D Echocardiogram has been performed.  Delcie Roch 03/22/2020, 10:50 AM

## 2020-03-22 NOTE — ED Notes (Signed)
Pt son came to pick her up. Pt verbalize understanding at the time of discharge.

## 2020-03-22 NOTE — ED Notes (Signed)
Patients son would like to know if patient is going to be admitted so that he can leave, would like a callback with an update [phone], Darin Engels.

## 2020-03-23 ENCOUNTER — Ambulatory Visit (HOSPITAL_COMMUNITY)
Admission: RE | Admit: 2020-03-23 | Discharge: 2020-03-23 | Disposition: A | Discharge status: Home / Self Care | Payer: Medicare Other | Source: Ambulatory Visit | Attending: Pulmonary Disease | Admitting: Pulmonary Disease

## 2020-03-23 DIAGNOSIS — J1289 Other viral pneumonia: Secondary | ICD-10-CM | POA: Diagnosis not present

## 2020-03-23 DIAGNOSIS — U071 COVID-19: Secondary | ICD-10-CM | POA: Insufficient documentation

## 2020-03-23 MED ORDER — SODIUM CHLORIDE 0.9 % IV SOLN: INTRAVENOUS | Status: DC | PRN

## 2020-03-23 MED ORDER — METHYLPREDNISOLONE SODIUM SUCC 125 MG IJ SOLR: 125.0000 mg | Freq: Once | INTRAMUSCULAR | Status: DC | PRN

## 2020-03-23 MED ORDER — SODIUM CHLORIDE 0.9 % IV SOLN
100.0000 mg | Freq: Once | INTRAVENOUS | Status: AC
  Administered 2020-03-23: 100 mg via INTRAVENOUS

## 2020-03-23 MED ORDER — ALBUTEROL SULFATE HFA 108 (90 BASE) MCG/ACT IN AERS: 2.0000 | INHALATION_SPRAY | Freq: Once | RESPIRATORY_TRACT | Status: DC | PRN

## 2020-03-23 MED ORDER — DIPHENHYDRAMINE HCL 50 MG/ML IJ SOLN: 50.0000 mg | Freq: Once | INTRAMUSCULAR | Status: DC | PRN

## 2020-03-23 MED ORDER — FAMOTIDINE IN NACL 20-0.9 MG/50ML-% IV SOLN: 20.0000 mg | Freq: Once | INTRAVENOUS | Status: DC | PRN

## 2020-03-23 MED ORDER — EPINEPHRINE 0.3 MG/0.3ML IJ SOAJ: 0.3000 mg | Freq: Once | INTRAMUSCULAR | Status: DC | PRN

## 2020-03-23 NOTE — Progress Notes (Signed)
  Diagnosis: COVID-19  Physician: Dr. Wright  Procedure: [facility] Infusion Clinic Med: remdesivir infusion - Provided patient with remdesivir fact sheet for patients, parents and caregivers prior to infusion.  Complications: No immediate complications noted.  Discharge: Discharged home   Johana Hopkinson Ann 03/23/2020    

## 2020-03-23 NOTE — Discharge Instructions (Signed)
10 Things You Can Do to Manage Your COVID-19 Symptoms at Home If you have possible or confirmed COVID-19: 1. Stay home from work and school. And stay away from other public places. If you must go out, avoid using any kind of public transportation, ridesharing, or taxis. 2. Monitor your symptoms carefully. If your symptoms get worse, call your healthcare provider immediately. 3. Get rest and stay hydrated. 4. If you have a medical appointment, call the healthcare provider ahead of time and tell them that you have or may have COVID-19. 5. For medical emergencies, call 911 and notify the dispatch personnel that you have or may have COVID-19. 6. Cover your cough and sneezes with a tissue or use the inside of your elbow. 7. Wash your hands often with soap and water for at least 20 seconds or clean your hands with an alcohol-based hand sanitizer that contains at least 60% alcohol. 8. As much as possible, stay in a specific room and away from other people in your home. Also, you should use a separate bathroom, if available. If you need to be around other people in or outside of the home, wear a mask. 9. Avoid sharing personal items with other people in your household, like dishes, towels, and bedding. 10. Clean all surfaces that are touched often, like counters, tabletops, and doorknobs. Use household cleaning sprays or wipes according to the label instructions. cdc.gov/coronavirus 09/15/2018 This information is not intended to replace advice given to you by your health care provider. Make sure you discuss any questions you have with your health care provider. Document Revised: 02/17/2019 Document Reviewed: 02/17/2019 Elsevier Patient Education  2020 Elsevier Inc.  If you have any questions or concerns after the infusion please call the Advanced Practice Provider on call at [phone]. This number is ONLY intended for your use regarding questions or concerns about the infusion post-treatment  side-effects.  Please do not provide this number to others for use. For return to work notes please contact your primary care provider.   If someone you know is interested in receiving treatment please have them call the COVID hotline at [phone].    

## 2020-03-23 NOTE — Progress Notes (Signed)
Patient reviewed Fact Sheet for Patients, Parents, and Caregivers for Emergency Use Authorization (EUA) of remdesivir for the Treatment of Coronavirus. Patient also reviewed and is agreeable to the estimated cost of treatment. Patient is agreeable to proceed.    

## 2020-03-23 NOTE — Progress Notes (Signed)
Spoke with Rexene Alberts, NP regarding increased BP. Okay to give Remdesivir and will follow up on BP after infusion.

## 2020-03-24 ENCOUNTER — Ambulatory Visit (HOSPITAL_COMMUNITY)
Admission: RE | Admit: 2020-03-24 | Discharge: 2020-03-24 | Disposition: A | Discharge status: Home / Self Care | Payer: Medicare Other | Source: Ambulatory Visit | Attending: Pulmonary Disease | Admitting: Pulmonary Disease

## 2020-03-24 DIAGNOSIS — U071 COVID-19: Secondary | ICD-10-CM | POA: Diagnosis not present

## 2020-03-24 MED ORDER — DIPHENHYDRAMINE HCL 50 MG/ML IJ SOLN: 50.0000 mg | Freq: Once | INTRAMUSCULAR | Status: DC | PRN

## 2020-03-24 MED ORDER — EPINEPHRINE 0.3 MG/0.3ML IJ SOAJ: 0.3000 mg | Freq: Once | INTRAMUSCULAR | Status: DC | PRN

## 2020-03-24 MED ORDER — SODIUM CHLORIDE 0.9 % IV SOLN
100.0000 mg | Freq: Once | INTRAVENOUS | Status: AC
  Administered 2020-03-24: 100 mg via INTRAVENOUS

## 2020-03-24 MED ORDER — ALBUTEROL SULFATE HFA 108 (90 BASE) MCG/ACT IN AERS: 2.0000 | INHALATION_SPRAY | Freq: Once | RESPIRATORY_TRACT | Status: DC | PRN

## 2020-03-24 MED ORDER — SODIUM CHLORIDE 0.9 % IV SOLN: INTRAVENOUS | Status: DC | PRN

## 2020-03-24 MED ORDER — FAMOTIDINE IN NACL 20-0.9 MG/50ML-% IV SOLN: 20.0000 mg | Freq: Once | INTRAVENOUS | Status: DC | PRN

## 2020-03-24 MED ORDER — METHYLPREDNISOLONE SODIUM SUCC 125 MG IJ SOLR: 125.0000 mg | Freq: Once | INTRAMUSCULAR | Status: DC | PRN

## 2020-03-24 NOTE — Discharge Instructions (Signed)
10 Things You Can Do to Manage Your COVID-19 Symptoms at Home If you have possible or confirmed COVID-19: 1. Stay home from work and school. And stay away from other public places. If you must go out, avoid using any kind of public transportation, ridesharing, or taxis. 2. Monitor your symptoms carefully. If your symptoms get worse, call your healthcare provider immediately. 3. Get rest and stay hydrated. 4. If you have a medical appointment, call the healthcare provider ahead of time and tell them that you have or may have COVID-19. 5. For medical emergencies, call 911 and notify the dispatch personnel that you have or may have COVID-19. 6. Cover your cough and sneezes with a tissue or use the inside of your elbow. 7. Wash your hands often with soap and water for at least 20 seconds or clean your hands with an alcohol-based hand sanitizer that contains at least 60% alcohol. 8. As much as possible, stay in a specific room and away from other people in your home. Also, you should use a separate bathroom, if available. If you need to be around other people in or outside of the home, wear a mask. 9. Avoid sharing personal items with other people in your household, like dishes, towels, and bedding. 10. Clean all surfaces that are touched often, like counters, tabletops, and doorknobs. Use household cleaning sprays or wipes according to the label instructions. cdc.gov/coronavirus 09/15/2018 This information is not intended to replace advice given to you by your health care provider. Make sure you discuss any questions you have with your health care provider. Document Revised: 02/17/2019 Document Reviewed: 02/17/2019 Elsevier Patient Education  2020 Elsevier Inc.  If you have any questions or concerns after the infusion please call the Advanced Practice Provider on call at [phone]. This number is ONLY intended for your use regarding questions or concerns about the infusion post-treatment  side-effects.  Please do not provide this number to others for use. For return to work notes please contact your primary care provider.   If someone you know is interested in receiving treatment please have them call the COVID hotline at [phone].    

## 2020-03-24 NOTE — Progress Notes (Signed)
  Diagnosis: COVID-19  Physician: Dr. Patrick Wright  Procedure: Covid Infusion Clinic Med: remdesivir infusion - Provided patient with remdesivir fact sheet for patients, parents and caregivers prior to infusion.  Complications: No immediate complications noted.  Discharge: Discharged home   Lynel Forester 03/24/2020  

## 2020-03-24 NOTE — Progress Notes (Signed)
Patient reviewed Fact Sheet for Patients, Parents, and Caregivers for Emergency Use Authorization (EUA) of remdesivir for the Treatment of Coronavirus. Patient also reviewed and is agreeable to the estimated cost of treatment. Patient is agreeable to proceed.    

## 2020-03-28 ENCOUNTER — Encounter (HOSPITAL_COMMUNITY): Payer: Self-pay | Admitting: Emergency Medicine

## 2020-03-28 ENCOUNTER — Telehealth (INDEPENDENT_AMBULATORY_CARE_PROVIDER_SITE_OTHER): Payer: Self-pay

## 2020-03-28 DIAGNOSIS — I10 Essential (primary) hypertension: Secondary | ICD-10-CM

## 2020-03-28 NOTE — Telephone Encounter (Signed)
Copied from CRM [id number]. Topic: General - Inquiry >> Mar 27, 2020  5:12 PM Adrian Prince D wrote: Reason for CRM: Patient has tested positive with covid and now is experiencing some headache and the patient son doesn't think her blood pressure medication is strong enough. She went to the hospital on last week and her blood pressure was very high. He would like a call back to discuss this. He can be reached at [phone]. Please advise

## 2020-03-29 MED ORDER — AMLODIPINE BESYLATE 10 MG PO TABS: 10.0000 mg | ORAL_TABLET | Freq: Every day | ORAL | 3 refills | Status: DC

## 2020-03-29 NOTE — Addendum Note (Signed)
Addended byHoy Register on: 03/29/2020 11:58 AM   Modules accepted: Orders

## 2020-03-29 NOTE — Telephone Encounter (Signed)
Pt is covid positive and her BP is elevated. Appt set for 05/23/20

## 2020-03-29 NOTE — Telephone Encounter (Signed)
Amlodipine dose has been increased from 5 to 10mg . Please advise to keep a BP log and call back with readings 5 days after initiation of new dose

## 2020-04-02 NOTE — Telephone Encounter (Signed)
Call placed to Amber Fisher and a VM was left informing him to return phone call.

## 2020-04-25 NOTE — Addendum Note (Signed)
Encounter addended by: Theia Dezeeuw A, RN on: 04/25/2020 6:20 PM  Actions taken: Charge Capture section accepted

## 2020-04-25 NOTE — Addendum Note (Signed)
Encounter addended by: Joana Nolton A, RN on: 04/25/2020 6:29 PM  Actions taken: Charge Capture section accepted

## 2020-05-23 ENCOUNTER — Other Ambulatory Visit: Payer: Self-pay

## 2020-05-23 ENCOUNTER — Telehealth: Payer: Self-pay | Admitting: Family Medicine

## 2020-05-23 ENCOUNTER — Ambulatory Visit: Payer: Medicare Other | Attending: Family Medicine | Admitting: Family Medicine

## 2020-05-23 ENCOUNTER — Encounter: Payer: Self-pay | Admitting: Family Medicine

## 2020-05-23 VITALS — BP 142/89 | HR 83 | Ht 60.0 in | Wt 114.6 lb

## 2020-05-23 DIAGNOSIS — I10 Essential (primary) hypertension: Secondary | ICD-10-CM | POA: Diagnosis not present

## 2020-05-23 DIAGNOSIS — M1990 Unspecified osteoarthritis, unspecified site: Secondary | ICD-10-CM | POA: Diagnosis not present

## 2020-05-23 DIAGNOSIS — M2550 Pain in unspecified joint: Secondary | ICD-10-CM

## 2020-05-23 DIAGNOSIS — Z79899 Other long term (current) drug therapy: Secondary | ICD-10-CM | POA: Insufficient documentation

## 2020-05-23 DIAGNOSIS — K5909 Other constipation: Secondary | ICD-10-CM

## 2020-05-23 DIAGNOSIS — Z7901 Long term (current) use of anticoagulants: Secondary | ICD-10-CM | POA: Insufficient documentation

## 2020-05-23 DIAGNOSIS — M7501 Adhesive capsulitis of right shoulder: Secondary | ICD-10-CM

## 2020-05-23 DIAGNOSIS — K219 Gastro-esophageal reflux disease without esophagitis: Secondary | ICD-10-CM | POA: Diagnosis not present

## 2020-05-23 DIAGNOSIS — M7502 Adhesive capsulitis of left shoulder: Secondary | ICD-10-CM | POA: Diagnosis not present

## 2020-05-23 MED ORDER — HYDROCHLOROTHIAZIDE 25 MG PO TABS: 25.0000 mg | ORAL_TABLET | Freq: Every day | ORAL | 1 refills | Status: DC

## 2020-05-23 MED ORDER — POLYETHYLENE GLYCOL 3350 17 GM/SCOOP PO POWD: 17.0000 g | Freq: Every day | ORAL | 1 refills | Status: AC

## 2020-05-23 MED ORDER — LIDOCAINE 5 % EX PTCH: 1.0000 | MEDICATED_PATCH | CUTANEOUS | 1 refills | Status: AC

## 2020-05-23 MED ORDER — DICLOFENAC SODIUM 1 % EX GEL: 4.0000 g | Freq: Four times a day (QID) | CUTANEOUS | 2 refills | Status: AC

## 2020-05-23 MED ORDER — AMLODIPINE BESYLATE 10 MG PO TABS: 10.0000 mg | ORAL_TABLET | Freq: Every day | ORAL | 1 refills | Status: DC

## 2020-05-23 MED ORDER — PANTOPRAZOLE SODIUM 40 MG PO TBEC: 40.0000 mg | DELAYED_RELEASE_TABLET | Freq: Every day | ORAL | 1 refills | Status: DC

## 2020-05-23 NOTE — Progress Notes (Signed)
Established Patient Office Visit  Subjective:  Patient ID: Amber Fisher, female    DOB: 1933-10-10  Age: 87 y.o. MRN: [id number]  CC:  Chief Complaint  Patient presents with  . Hypertension    HPI Amber Fisher is an 87 year old female with a history of hypertension, GERD, osteoarthritis, history of COVID-19 in 03/2020 status post outpatient treatment with Remdesivir who presents today for an office visit accompanied by her son.  [place] and requests a 90-day supply of her medications. Complains of pain everywhere - both hands, shoulders with restricted range of motion of her upper extremities to the point that she has difficulty tying her headgear.  Tylenol arthritis does provide relief of her symptoms. Also has constipation and informs me she is unable to remember the last time she moved her bowels.  Son states she eats a lot of fruits and they usually purchase whole-wheat bread at home. For her GERD she would like to change to Protonix which a friend of hers had been taking and did better on.  She is currently on omeprazole and per her son she has been taking it as needed.  She complains her reflux symptoms are uncontrolled. Also complains of puffiness beneath her eyelids. Past Medical History:  Diagnosis Date  . Acute blood loss anemia   . 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 Cough   . DVT (deep venous thrombosis) (HCC)   . Heart murmur   . Hypertension   . Leukocytosis   . Painful orthopaedic hardware (HCC)   . Pressure ulcer     Past Surgical History:  Procedure Laterality Date  . HIP ARTHROPLASTY Left 04/28/2015   Procedure: ARTHROPLASTY BIPOLAR HIP (HEMIARTHROPLASTY  WITH HARDWARE REMOVAL, ;  Surgeon: Jodi Geralds, MD;  Location: [facility] OR;  Service: Orthopedics;  Laterality: Left;  . HIP SURGERY Left   . HIP SURGERY      No family history on file.  Social History   Socioeconomic History  . Marital status: Widowed     Spouse name: Not on file  . Number of children: Not on file  . Years of education: Not on file  . Highest education level: Not on file  Occupational History  . Not on file  Tobacco Use  . Smoking status: Never Smoker  . Smokeless tobacco: Never Used  Substance and Sexual Activity  . Alcohol use: No  . Drug use: No  . Sexual activity: Not on file  Other Topics Concern  . Not on file  Social History Narrative   ** Merged History Encounter **       Social Determinants of Health   Financial Resource Strain: Not on file  Food Insecurity: Not on file  Transportation Needs: Not on file  Physical Activity: Not on file  Stress: Not on file  Social Connections: Not on file  Intimate Partner Violence: Not on file    Outpatient Medications Prior to Visit  Medication Sig Dispense Refill  . apixaban (ELIQUIS) 5 MG TABS tablet Take 5 mg by mouth 2 (two) times daily.    . Ascorbic Acid (VITAMIN C WITH ROSE HIPS) 500 MG tablet Take 1 tablet (500 mg total) by mouth daily.    . hydrOXYzine (ATARAX/VISTARIL) 25 MG tablet Take 25 mg by mouth daily.    . traMADol (ULTRAM) 50 MG tablet Take 1 tablet (50 mg total) by mouth every 6 (six) hours as needed. 15 tablet 0  .  Zinc Sulfate 220 (50 Zn) MG TABS Take 1 tablet (220 mg total) by mouth daily.  0  . amLODipine (NORVASC) 10 MG tablet Take 1 tablet (10 mg total) by mouth daily. 30 tablet 3  . diclofenac Sodium (VOLTAREN) 1 % GEL Apply 4 g topically 4 (four) times daily. 100 g 2  . hydrochlorothiazide (HYDRODIURIL) 25 MG tablet Take 1 tablet (25 mg total) by mouth daily. 30 tablet 3  . lidocaine (LIDODERM) 5 % Place 1 patch onto the skin daily. Remove & Discard patch within 12 hours or as directed by MD 30 patch 2  . omeprazole (PRILOSEC) 40 MG capsule Take 1 capsule (40 mg total) by mouth daily. 30 capsule 3  . omeprazole (PRILOSEC) 40 MG capsule Take 40 mg by mouth daily.    . cephALEXin (KEFLEX) 500 MG capsule Take 1 capsule (500 mg total) by  mouth 2 (two) times daily. (Patient not taking: No sig reported) 6 capsule 0   No facility-administered medications prior to visit.    No Known Allergies  ROS Review of Systems  Constitutional: Negative for activity change, appetite change and fatigue.  HENT: Negative for congestion, sinus pressure and sore throat.   Eyes: Negative for visual disturbance.  Respiratory: Negative for cough, chest tightness, shortness of breath and wheezing.   Cardiovascular: Negative for chest pain and palpitations.  Gastrointestinal: Negative for abdominal distention, abdominal pain and constipation.  Endocrine: Negative for polydipsia.  Genitourinary: Negative for dysuria and frequency.  Musculoskeletal:       See HPI  Skin: Negative for rash.  Neurological: Negative for tremors, light-headedness and numbness.  Hematological: Does not bruise/bleed easily.  Psychiatric/Behavioral: Negative for agitation and behavioral problems.      Objective:    Physical Exam Constitutional:      Appearance: She is well-developed and well-nourished.  Eyes:     Comments: Slight infraorbital edema bilaterally  Cardiovascular:     Rate and Rhythm: Normal rate.     Pulses: Intact distal pulses.     Heart sounds: Normal heart sounds. No murmur heard.   Pulmonary:     Effort: Pulmonary effort is normal.     Breath sounds: Normal breath sounds. No wheezing or rales.  Chest:     Chest wall: No tenderness.  Abdominal:     General: Bowel sounds are normal. There is no distension.     Palpations: Abdomen is soft. There is no mass.     Tenderness: There is no abdominal tenderness.  Musculoskeletal:     Comments: Severely restricted range of motion in both upper extremities.  Neurological:     Mental Status: She is alert and oriented to person, place, and time.     BP (!) 142/89   Pulse 83   Ht 5' (1.524 m)   Wt 114 lb 9.6 oz (52 kg)   SpO2 98%   BMI 22.38 kg/m  Wt Readings from Last 3 Encounters:   05/23/20 114 lb 9.6 oz (52 kg)  03/13/20 100 lb (45.4 kg)  02/28/20 123 lb (55.8 kg)     Health Maintenance Due  Topic Date Due  . COVID-19 Vaccine (1) Never done  . TETANUS/TDAP  Never done  . DEXA SCAN  Never done  . PNA vac Low Risk Adult (1 of 2 - PCV13) Never done  . INFLUENZA VACCINE  Never done    There are no preventive care reminders to display for this patient.  No results found for: TSH Lab Results  Component Value Date   WBC 8.3 03/21/2020   HGB 13.2 03/21/2020   HCT 38.1 03/21/2020   MCV 88.4 03/21/2020   PLT 382 03/21/2020   Lab Results  Component Value Date   NA 134 (L) 03/21/2020   K 3.3 (L) 03/21/2020   CO2 26 03/21/2020   GLUCOSE 93 03/21/2020   BUN 17 03/21/2020   CREATININE 1.02 (H) 03/21/2020   BILITOT 0.3 01/18/2020   ALKPHOS 101 01/18/2020   AST 29 01/18/2020   ALT 11 01/18/2020   PROT 8.0 01/18/2020   ALBUMIN 4.1 01/18/2020   CALCIUM 9.5 03/21/2020   ANIONGAP 13 03/21/2020   No results found for: CHOL No results found for: HDL No results found for: LDLCALC No results found for: TRIG No results found for: CHOLHDL No results found for: DDUK0U    Assessment & Plan:  1. Essential hypertension Slightly above goal No regimen change today Counseled on blood pressure goal of less than 130/80, low-sodium, DASH diet, medication compliance, 150 minutes of moderate intensity exercise per week. Discussed medication compliance, adverse effects. - amLODipine (NORVASC) 10 MG tablet; Take 1 tablet (10 mg total) by mouth daily.  Dispense: 90 tablet; Refill: 1 - hydrochlorothiazide (HYDRODIURIL) 25 MG tablet; Take 1 tablet (25 mg total) by mouth daily.  Dispense: 90 tablet; Refill: 1  2. 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  Will plan to follow-up on  this when she returns - diclofenac Sodium (VOLTAREN) 1 % GEL; Apply 4 g topically 4 (four) times daily.  Dispense: 100 g; Refill: 2 - lidocaine (LIDODERM) 5 %; Place 1 patch onto the skin daily. Remove & Discard patch within 12 hours or as directed by MD  Dispense: 90 patch; Refill: 1  3. Gastroesophageal reflux disease without esophagitis Uncontrolled due to not taking medication as prescribed Switched from omeprazole to pantoprazole per patient request and compliance has been emphasized - pantoprazole (PROTONIX) 40 MG tablet; Take 1 tablet (40 mg total) by mouth daily.  Dispense: 90 tablet; Refill: 1  4. Other constipation Increase fiber intake - polyethylene glycol powder (GLYCOLAX/MIRALAX) 17 GM/SCOOP powder; Take 17 g by mouth daily.  Dispense: 3350 g; Refill: 1  5. Arthralgia, unspecified joint Refilled Voltaren gel   Hoy Register, MD

## 2020-05-23 NOTE — Progress Notes (Signed)
Not sleeping Face hurts Constipation. GERD wants to change to protonix.

## 2020-05-23 NOTE — Patient Instructions (Signed)
Constipation, Adult Constipation is when a person has trouble pooping (having a bowel movement). When you have this condition, you may poop fewer than 3 times a week. Your poop (stool) may also be dry, hard, or bigger than normal. Follow these instructions at home: Eating and drinking  Eat foods that have a lot of fiber, such as: ? Fresh fruits and vegetables. ? Whole grains. ? Beans.  Eat less of foods that are low in fiber and high in fat and sugar, such as: ? French fries. ? Hamburgers. ? Cookies. ? Candy. ? Soda.  Drink enough fluid to keep your pee (urine) pale yellow.   General instructions  Exercise regularly or as told by your doctor. Try to do 150 minutes of exercise each week.  Go to the restroom when you feel like you need to poop. Do not hold it in.  Take over-the-counter and prescription medicines only as told by your doctor. These include any fiber supplements.  When you poop: ? Do deep breathing while relaxing your lower belly (abdomen). ? Relax your pelvic floor. The pelvic floor is a group of muscles that support the rectum, bladder, and intestines (as well as the uterus in women).  Watch your condition for any changes. Tell your doctor if you notice any.  Keep all follow-up visits as told by your doctor. This is important. Contact a doctor if:  You have pain that gets worse.  You have a fever.  You have not pooped for 4 days.  You vomit.  You are not hungry.  You lose weight.  You are bleeding from the opening of the butt (anus).  You have thin, pencil-like poop. Get help right away if:  You have a fever, and your symptoms suddenly get worse.  You leak poop or have blood in your poop.  Your belly feels hard or bigger than normal (bloated).  You have very bad belly pain.  You feel dizzy or you faint. Summary  Constipation is when a person poops fewer than 3 times a week, has trouble pooping, or has poop that is dry, hard, or bigger than  normal.  Eat foods that have a lot of fiber.  Drink enough fluid to keep your pee (urine) pale yellow.  Take over-the-counter and prescription medicines only as told by your doctor. These include any fiber supplements. This information is not intended to replace advice given to you by your health care provider. Make sure you discuss any questions you have with your health care provider. Document Revised: 01/19/2019 Document Reviewed: 01/19/2019 Elsevier Patient Education  2021 [employer] Inc.  

## 2020-11-09 ENCOUNTER — Other Ambulatory Visit: Payer: Self-pay

## 2021-02-04 ENCOUNTER — Other Ambulatory Visit: Payer: Self-pay | Admitting: Family Medicine

## 2021-02-04 DIAGNOSIS — I10 Essential (primary) hypertension: Secondary | ICD-10-CM

## 2021-02-04 DIAGNOSIS — K219 Gastro-esophageal reflux disease without esophagitis: Secondary | ICD-10-CM

## 2021-02-04 NOTE — Telephone Encounter (Signed)
Refills requests were received for the Protonix 40 mg and HCTZ 25 mg.   I called pt via [employer] [phone] [place] and left a voicemail for her to call [facility] for an appt for her check up.  I gave a 30 day courtesy refill for both requests.

## 2021-02-04 NOTE — Telephone Encounter (Signed)
Ok for 15- original Rx written for 90 Requested Prescriptions  Pending Prescriptions Disp Refills  . pantoprazole (PROTONIX) 40 MG tablet [Pharmacy Med Name: PANTOPRAZOLE 40MG  TABLETS] 90 tablet 0    Sig: TAKE 1 TABLET(40 MG) BY MOUTH DAILY     Gastroenterology: Proton Pump Inhibitors Passed - 02/04/2021 11:14 AM      Passed - Valid encounter within last 12 months    Recent Outpatient Visits          8 months ago Gastroesophageal reflux disease without esophagitis   [facility] [place], [place], [place]   11 months ago Neck pain   [facility] [place], [place], [place]   1 year ago Accelerated hypertension   [facility], [place]

## 2021-05-26 ENCOUNTER — Other Ambulatory Visit: Payer: Self-pay | Admitting: Family Medicine

## 2021-05-26 DIAGNOSIS — I10 Essential (primary) hypertension: Secondary | ICD-10-CM

## 2021-05-27 NOTE — Telephone Encounter (Signed)
Requested medications are due for refill today.  yes ? ?Requested medications are on the active medications list.  yes ? ?Last refill. 05/23/2020 #90/ 1 refill ? ?Future visit scheduled.   no ? ?Notes to clinic.  Unsure if pt still goes to [facility] - PCP lasted as Dr. Mikeal Hawthorne ? ? ? ?Requested Prescriptions  ?Pending Prescriptions Disp Refills  ? amLODipine (NORVASC) 10 MG tablet [Pharmacy Med Name: AMLODIPINE BESYLATE 10MG  TABLETS] 90 tablet 1  ?  Sig: TAKE 1 TABLET(10 MG) BY MOUTH DAILY  ?  ? Cardiovascular: Calcium Channel Blockers 2 Failed - 05/26/2021  4:05 AM  ?  ?  Failed - Last BP in normal range  ?  BP Readings from Last 1 Encounters:  ?05/23/20 (!) 142/89  ?  ?  ?  ?  Failed - Valid encounter within last 6 months  ?  Recent Outpatient Visits   ? ?      ? 1 year ago Gastroesophageal reflux disease without esophagitis  ? [facility], [place]  ? 1 year ago Neck pain  ? [facility], [place]  ? 1 year ago Accelerated hypertension  ? [facility] [place], [place], [place]  ? ?  ?  ? ?  ?  ?  Passed - Last Heart Rate in normal range  ?  Pulse Readings from Last 1 Encounters:  ?05/23/20 83  ?  ?  ?  ?  ?  ?

## 2022-01-11 ENCOUNTER — Other Ambulatory Visit: Payer: Self-pay | Admitting: Family Medicine

## 2022-01-11 DIAGNOSIS — K219 Gastro-esophageal reflux disease without esophagitis: Secondary | ICD-10-CM
